# Patient Record
Sex: Male | Born: 1970
Health system: Southern US, Community
[De-identification: ages and names within clinical notes are randomized; demographics above are authoritative.]

## PROBLEM LIST (undated history)

## (undated) DIAGNOSIS — I10 Essential (primary) hypertension: Secondary | ICD-10-CM

## (undated) DIAGNOSIS — N529 Male erectile dysfunction, unspecified: Secondary | ICD-10-CM

## (undated) HISTORY — DX: Essential (primary) hypertension: I10

## (undated) HISTORY — DX: Male erectile dysfunction, unspecified: N52.9

---

## 2004-10-02 ENCOUNTER — Other Ambulatory Visit: Payer: Self-pay

## 2004-10-02 ENCOUNTER — Emergency Department: Payer: Self-pay | Admitting: Unknown Physician Specialty

## 2011-11-25 HISTORY — PX: CARPAL TUNNEL RELEASE: SHX101

## 2013-01-25 ENCOUNTER — Ambulatory Visit: Payer: Self-pay | Admitting: Orthopedic Surgery

## 2014-09-18 ENCOUNTER — Ambulatory Visit: Payer: Self-pay | Admitting: Family Medicine

## 2015-03-16 NOTE — Op Note (Signed)
PATIENT NAME:  Shane Hayes, [name] MR#:  [id number] DATE OF BIRTH:  05/05/1971  DATE OF PROCEDURE:  01/25/2013  PREOPERATIVE DIAGNOSIS: Left carpal tunnel syndrome.   POSTOPERATIVE DIAGNOSIS: Left carpal tunnel syndrome.   PROCEDURE: Left carpal tunnel release.   SURGEON: Leitha SchullerMichael J. Menz, MD  ANESTHESIA:  General.  DESCRIPTION OF PROCEDURE: The patient was brought to the operating room and, after adequate anesthesia was obtained, the left arm was prepped and draped in the usual sterile fashion. After patient identification and timeout procedures were completed, the tourniquet was raised to 250 mmHg. An approximately 2 cm incision was made in line with the ring metacarpal, over the carpal tunnel. The skin and subcutaneous tissue was incised and the transverse carpal ligament identified, a small incision made into it, and a hemostat placed underneath to protect the underlying structures. Release was carried out distally to a point where there was fat surrounding the nerve. In the proximal third of the carpal tunnel, there did appear to be compression on the nerve. After release there was good vascular blush. Exploration of the carpal tunnel revealed no masses. There was moderate flexor tenosynovitis. After adequate release of the nerve, the wound was irrigated and closed with simple interrupted 5-0 nylon skin sutures. 10 mL of 0.5% Sensorcaine without epinephrine was infiltrated in the tissue adjacent to the incision. Sterile dressing of Xeroform, 4 x 4's, Webril, and Ace wrap were applied, and the patient was sent to the recovery room in stable condition.   ESTIMATED BLOOD LOSS: Minimal.   COMPLICATIONS: None.        SPECIMEN: None.   TOURNIQUET TIME: 90 minutes at 250 mmHg. ____________________________ Leitha SchullerMichael J. Menz, MD mjm:sb D: 01/26/2013 06:52:03 ET T: 01/26/2013 07:06:49 ET JOB#: [id number]  cc: Leitha SchullerMichael J. Menz, MD, <Dictator> Leitha SchullerMICHAEL J MENZ MD ELECTRONICALLY SIGNED 01/26/2013 8:16

## 2015-08-03 ENCOUNTER — Encounter: Payer: Self-pay | Admitting: Family Medicine

## 2015-08-03 ENCOUNTER — Ambulatory Visit (INDEPENDENT_AMBULATORY_CARE_PROVIDER_SITE_OTHER): Payer: 59 | Admitting: Family Medicine

## 2015-08-03 VITALS — BP 116/86 | HR 66 | Temp 97.9°F | Resp 16 | Ht 65.5 in | Wt 223.8 lb

## 2015-08-03 DIAGNOSIS — R519 Headache, unspecified: Secondary | ICD-10-CM

## 2015-08-03 DIAGNOSIS — Z9889 Other specified postprocedural states: Secondary | ICD-10-CM | POA: Insufficient documentation

## 2015-08-03 DIAGNOSIS — Z0000 Encounter for general adult medical examination without abnormal findings: Secondary | ICD-10-CM

## 2015-08-03 DIAGNOSIS — Z87438 Personal history of other diseases of male genital organs: Secondary | ICD-10-CM | POA: Insufficient documentation

## 2015-08-03 DIAGNOSIS — R51 Headache: Secondary | ICD-10-CM | POA: Diagnosis not present

## 2015-08-03 DIAGNOSIS — N529 Male erectile dysfunction, unspecified: Secondary | ICD-10-CM

## 2015-08-03 DIAGNOSIS — J309 Allergic rhinitis, unspecified: Secondary | ICD-10-CM | POA: Insufficient documentation

## 2015-08-03 MED ORDER — SILDENAFIL CITRATE 100 MG PO TABS: 50.0000 mg | ORAL_TABLET | Freq: Every day | ORAL | Status: DC | PRN

## 2015-08-03 MED ORDER — AMITRIPTYLINE HCL 10 MG PO TABS: ORAL_TABLET | ORAL | Status: DC

## 2015-08-03 NOTE — Progress Notes (Signed)
Subjective:     Patient ID: Shane Hayes, male   DOB: Oct 14, 1971, 44 y.o.   MRN: [id number]  HPI  Chief Complaint  Patient presents with  . Annual Exam    Patient is present in office today for his annual physical he states that he would like to address headaches he has been having. Patient reports having a history of chronic headaches and would like to discuss being placed back on medication.  Estimates he gets 4 headaches/week lasting from 1-4 hours. Usually relieved with aspirin or BC powders. No associated nausea or photophobia. Generally will stay at work and not have to leave. States he drinks only one caffeine drink a day but has noticed trouble with near vision. 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 He has biometric form for completion today from his employer.   Review of Systems General: Feeling well HEENT: no dental or eye exams. Encourage to update. Cardiovascular: no chest pain, shortness of breath, or palpitations GI: no heartburn, no change in bowel habits or blood in the stool GU: nocturia x 1, feels he is urinating more frequently during the day. Wishes to try medication for previously diagnosed erectile dysfunction Psychiatric: not depressed: PHQ 2: 0 Musculoskeletal: no joint pain    Objective:   Physical Exam  Constitutional: He appears well-developed and well-nourished. No distress.  Eyes: PERRLA Neck: no thyromegaly, tenderness or nodules,  ENT: TM's intact without inflammation; Tonsils not well seen Lungs: Clear Heart : RRR without murmur or gallop Abd: bowel sounds present, soft, non-tender, no organomegaly Extremities: no edema     Assessment:    1. Annual physical exam - Comprehensive metabolic panel - Lipid panel  2. Frequent headaches: may be due to visual changes requiring correction - amitriptyline (ELAVIL) 10 MG tablet; One to three at bedtime  Dispense: 30 tablet; Refill: 1  3. Erectile dysfunction,  unspecified erectile dysfunction type - sildenafil (VIAGRA) 100 MG tablet; Take 0.5-1 tablets (50-100 mg total) by mouth daily as needed for erectile dysfunction.  Dispense: 5 tablet; Refill: 11    Plan:    Further f/u pending lab work. Form completion at that time.

## 2015-08-03 NOTE — Patient Instructions (Signed)
We will call you with the lab results. Please schedule eye and dental exams. Try amitriptyline daily to reduce headache.

## 2015-08-04 LAB — COMPREHENSIVE METABOLIC PANEL
A/G RATIO: 2.1 (ref 1.1–2.5)
ALBUMIN: 4.7 g/dL (ref 3.5–5.5)
ALT: 30 IU/L (ref 0–44)
AST: 21 IU/L (ref 0–40)
Alkaline Phosphatase: 65 IU/L (ref 39–117)
BILIRUBIN TOTAL: 0.4 mg/dL (ref 0.0–1.2)
BUN / CREAT RATIO: 11 (ref 9–20)
BUN: 15 mg/dL (ref 6–24)
CALCIUM: 10.1 mg/dL (ref 8.7–10.2)
CHLORIDE: 101 mmol/L (ref 97–108)
CO2: 25 mmol/L (ref 18–29)
Creatinine, Ser: 1.36 mg/dL — ABNORMAL HIGH (ref 0.76–1.27)
GFR, EST AFRICAN AMERICAN: 72 mL/min/{1.73_m2} (ref >59)
GFR, EST NON AFRICAN AMERICAN: 62 mL/min/{1.73_m2} (ref >59)
GLOBULIN, TOTAL: 2.2 g/dL (ref 1.5–4.5)
Glucose: 102 mg/dL — ABNORMAL HIGH (ref 65–99)
POTASSIUM: 4.2 mmol/L (ref 3.5–5.2)
Sodium: 141 mmol/L (ref 134–144)
TOTAL PROTEIN: 6.9 g/dL (ref 6.0–8.5)

## 2015-08-04 LAB — LIPID PANEL
CHOL/HDL RATIO: 3.8 ratio (ref 0.0–5.0)
Cholesterol, Total: 136 mg/dL (ref 100–199)
HDL: 36 mg/dL — AB (ref >39)
LDL Calculated: 82 mg/dL (ref 0–99)
Triglycerides: 89 mg/dL (ref 0–149)
VLDL Cholesterol Cal: 18 mg/dL (ref 5–40)

## 2015-08-06 ENCOUNTER — Telehealth: Payer: Self-pay

## 2015-08-06 NOTE — Telephone Encounter (Signed)
Faxed today

## 2015-08-06 NOTE — Telephone Encounter (Signed)
[id number] 

## 2015-08-06 NOTE — Telephone Encounter (Signed)
Unable to reach patient and let him know about labs, informed girlfriend that his papers have been faxed to employer and to have patient call office back to discuss labs. KW

## 2015-08-06 NOTE — Telephone Encounter (Signed)
Pt girlfriend, Candis states pt came in last Friday to have a CPE and left paper work for his employer.  Pt is requesting this fax back to his employer, [employer] as soon as possible.  Fax #.[phone].  CB#[phone] or [phone]

## 2015-08-06 NOTE — Telephone Encounter (Signed)
[name] have you seen/completed paperwork?

## 2015-08-06 NOTE — Telephone Encounter (Signed)
-----   Message from Anola Gurney, [place] sent at 08/06/2015  7:59 AM EDT ----- Mild sugar elevation and mild abnormality in your kidney function. Would like to repeat these labs in 3 months. Your cholesterol looks good.

## 2015-08-07 NOTE — Telephone Encounter (Signed)
Patient has been advised of lab report. KW 

## 2015-08-23 ENCOUNTER — Ambulatory Visit (INDEPENDENT_AMBULATORY_CARE_PROVIDER_SITE_OTHER): Payer: 59 | Admitting: Family Medicine

## 2015-08-23 ENCOUNTER — Encounter: Payer: Self-pay | Admitting: Family Medicine

## 2015-08-23 VITALS — BP 102/78 | HR 68 | Temp 97.8°F | Resp 16 | Ht 66.0 in | Wt 222.8 lb

## 2015-08-23 DIAGNOSIS — M546 Pain in thoracic spine: Secondary | ICD-10-CM | POA: Diagnosis not present

## 2015-08-23 DIAGNOSIS — N529 Male erectile dysfunction, unspecified: Secondary | ICD-10-CM

## 2015-08-23 MED ORDER — MELOXICAM 15 MG PO TABS: 15.0000 mg | ORAL_TABLET | Freq: Every day | ORAL | Status: DC

## 2015-08-23 MED ORDER — SILDENAFIL CITRATE 20 MG PO TABS: ORAL_TABLET | ORAL | Status: DC

## 2015-08-23 NOTE — Progress Notes (Signed)
Subjective:     Patient ID: Shane Hayes, male   DOB: 06-18-1971, 44 y.o.   MRN: [id number]  HPI  Chief Complaint  Patient presents with  . Flank Pain    Patient comes in office today with concerns of left sided back pain for the past 2 weeks, patient denies any fall or injury and states that it is hard for him to sleep or change positions.   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 Has not taken any medication for this. Reports pain increases with sitting and when he lies on his right side. Denies rash, shortness of breath or dysuria. Accompanied by his wife today.   Review of Systems  Genitourinary:       Wishes dose/formulation change of Viagra due to cost  Neurological:       Has not been consistently taking nortriptyline for his chronic headaches though in general he states frequency is less. Encouraged scheduling of medication.       Objective:   Physical Exam  Constitutional: He appears well-developed and well-nourished. No distress.  Pulmonary/Chest: Breath sounds normal. No respiratory distress. He has no wheezes.  Musculoskeletal:  Localizes pain to his left lower thoracic para-vertebral area. Not tender to the touch. No rash.       Assessment:    1. Left-sided thoracic back pain - meloxicam (MOBIC) 15 MG tablet; Take 1 tablet (15 mg total) by mouth daily.  Dispense: 30 tablet; Refill: 0  2. 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

## 2015-08-23 NOTE — Patient Instructions (Signed)
Let me know if your back pain does not improve.

## 2015-10-01 ENCOUNTER — Ambulatory Visit (INDEPENDENT_AMBULATORY_CARE_PROVIDER_SITE_OTHER): Payer: 59 | Admitting: Family Medicine

## 2015-10-01 ENCOUNTER — Ambulatory Visit
Admission: RE | Admit: 2015-10-01 | Discharge: 2015-10-01 | Disposition: A | Discharge status: Home / Self Care | Payer: 59 | Source: Ambulatory Visit | Attending: Family Medicine | Admitting: Family Medicine

## 2015-10-01 ENCOUNTER — Encounter: Payer: Self-pay | Admitting: Family Medicine

## 2015-10-01 ENCOUNTER — Telehealth: Payer: Self-pay | Admitting: Family Medicine

## 2015-10-01 VITALS — BP 122/90 | HR 90 | Temp 98.5°F | Resp 16 | Wt 228.4 lb

## 2015-10-01 DIAGNOSIS — M79662 Pain in left lower leg: Secondary | ICD-10-CM

## 2015-10-01 DIAGNOSIS — M7989 Other specified soft tissue disorders: Secondary | ICD-10-CM | POA: Diagnosis present

## 2015-10-01 DIAGNOSIS — R51 Headache: Secondary | ICD-10-CM | POA: Diagnosis not present

## 2015-10-01 DIAGNOSIS — R519 Headache, unspecified: Secondary | ICD-10-CM

## 2015-10-01 MED ORDER — MELOXICAM 15 MG PO TABS: 15.0000 mg | ORAL_TABLET | Freq: Every day | ORAL | Status: DC

## 2015-10-01 MED ORDER — AMITRIPTYLINE HCL 10 MG PO TABS: ORAL_TABLET | ORAL | Status: DC

## 2015-10-01 NOTE — Progress Notes (Signed)
Subjective:     Patient ID: Shane Hayes, male   DOB: 21-Jan-1971, 44 y.o.   MRN: [id number]  HPI  Chief Complaint  Patient presents with  . Leg Swelling    Patient comes in office today with concerns of swelling of his lower left extremity for the past 2 weeks. Patient states that over the weekend he had swelling of his calf, he denies being on any new medication or any new activities.   States he also developed left calf pain and a low grade fever since 11/4. Swelling has gone done today per his report. No specific injury. 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 Though at times he must walk up stairs. Accompanied by his wife today.   Review of Systems  Respiratory: Negative for shortness of breath.        Objective:   Physical Exam  Constitutional: He appears well-developed and well-nourished. No distress.  Cardiovascular:  Pulses:      Dorsalis pedis pulses are 2+ on the left side.       Posterior tibial pulses are 2+ on the left side.  Musculoskeletal:  Left calf is swollen and tender though no erythema is appreciated. No pitting edema distally.       Assessment:    1. Frequent headache - amitriptyline (ELAVIL) 10 MG tablet; One to three at bedtime  Dispense: 30 tablet; Refill: 5  2. Calf pain, left: R/o DVT ? gastroc. strain - meloxicam (MOBIC) 15 MG tablet; Take 1 tablet (15 mg total) by mouth daily.  Dispense: 30 tablet; Refill: 0 - US Venous Img Lower Unilateral Left; Future    Plan:    Further f/u pending doppler result.

## 2015-10-01 NOTE — Telephone Encounter (Signed)
Call Report Ultrasound venous left lower leg for DVT: Negative for DVT in the left leg,complex fluid collection in the posterior calf. Could be a localized hematoma or represent changes of ruptured popliteal as clinical coordination reccommended.

## 2015-10-01 NOTE — Patient Instructions (Signed)
We will call with your results.  

## 2015-10-03 ENCOUNTER — Telehealth: Payer: Self-pay | Admitting: Family Medicine

## 2015-10-03 ENCOUNTER — Other Ambulatory Visit: Payer: Self-pay | Admitting: Family Medicine

## 2015-10-03 DIAGNOSIS — M79605 Pain in left leg: Secondary | ICD-10-CM

## 2015-10-03 NOTE — Telephone Encounter (Signed)
Patients girlfriend Financial trader( Candice) had contacted office because she has concerns about patients leg. She reports that swelling is worse around area but denies patient complaining of pain or fever. She is cocnerned and wants to know what they should do see ultrasound did not show blood clot? Please advise. KW

## 2015-10-03 NOTE — Telephone Encounter (Signed)
Advised girlfriend that referral has been put in place for patient to be followed up through Ortho, per Nadine CountsBob okay to advise patient to return to work if he is not experiencing pain. KW

## 2015-10-03 NOTE — Telephone Encounter (Signed)
I am going to refer him to orthopedics for evaluation. The swelling in his calf may hurt the calf muscle.

## 2015-11-12 ENCOUNTER — Other Ambulatory Visit: Payer: Self-pay | Admitting: Family Medicine

## 2015-12-31 ENCOUNTER — Other Ambulatory Visit: Payer: Self-pay | Admitting: Family Medicine

## 2015-12-31 ENCOUNTER — Telehealth: Payer: Self-pay | Admitting: Family Medicine

## 2015-12-31 DIAGNOSIS — R51 Headache: Principal | ICD-10-CM

## 2015-12-31 DIAGNOSIS — R519 Headache, unspecified: Secondary | ICD-10-CM

## 2015-12-31 MED ORDER — AMITRIPTYLINE HCL 10 MG PO TABS: ORAL_TABLET | ORAL | Status: DC

## 2015-12-31 NOTE — Telephone Encounter (Signed)
done

## 2015-12-31 NOTE — Telephone Encounter (Signed)
Please review. Thanks!  

## 2015-12-31 NOTE — Telephone Encounter (Signed)
Pt's girlfriend called and request a refill be sent for amitriptyline (ELAVIL) 10 MG tablet to [facility] b/c pt's insurance doesn't cover [facility]. Thanks TNP

## 2016-01-18 ENCOUNTER — Telehealth: Payer: Self-pay | Admitting: Family Medicine

## 2016-01-18 ENCOUNTER — Other Ambulatory Visit: Payer: Self-pay | Admitting: Family Medicine

## 2016-01-18 NOTE — Telephone Encounter (Signed)
Should have 5 refills left on prior prescription.

## 2016-01-18 NOTE — Telephone Encounter (Signed)
Pt contacted office for refill request on the following medications:  amitriptyline (ELAVIL) 10 MG tablet.  [facility].  [phone]

## 2016-01-18 NOTE — Telephone Encounter (Signed)
Please review. Thanks!  

## 2016-01-21 NOTE — Telephone Encounter (Signed)
Unable to reach patient at this time phone continues to ring with no answer, will try contacting patient again at a later time. KW

## 2016-01-23 NOTE — Telephone Encounter (Signed)
[id number] 

## 2016-02-12 ENCOUNTER — Ambulatory Visit (INDEPENDENT_AMBULATORY_CARE_PROVIDER_SITE_OTHER): Payer: 59 | Admitting: Family Medicine

## 2016-02-12 ENCOUNTER — Encounter: Payer: Self-pay | Admitting: Family Medicine

## 2016-02-12 VITALS — BP 142/96 | HR 64 | Temp 97.6°F | Resp 16 | Wt 227.2 lb

## 2016-02-12 DIAGNOSIS — R519 Headache, unspecified: Secondary | ICD-10-CM | POA: Insufficient documentation

## 2016-02-12 DIAGNOSIS — R51 Headache: Secondary | ICD-10-CM | POA: Diagnosis not present

## 2016-02-12 DIAGNOSIS — K529 Noninfective gastroenteritis and colitis, unspecified: Secondary | ICD-10-CM

## 2016-02-12 MED ORDER — AMITRIPTYLINE HCL 10 MG PO TABS: ORAL_TABLET | ORAL | Status: DC

## 2016-02-12 NOTE — Patient Instructions (Signed)
Discussed use of Gatorade and continue to eat as tolerated. May use imodium for loose stools.

## 2016-02-12 NOTE — Progress Notes (Signed)
Subjective:     Patient ID: Shane Hayes, male   DOB: 03/25/1971, 45 y.o.   MRN: [id number]  HPI  Chief Complaint  Patient presents with  . Headache    Patient comes in office today to address frequent headaches that he has been experencing intermittent since 10/01/15. Patient reports taking Amtriptyline but states it has not helped with headaches.   . Emesis    Patient complains of vomiting and diarrhea since Saturday 3/18, he states that he has weakness and yesterday felt feverish. Patient denies anyone else in household with similar symptoms  States he has been taking amitriptyline daily (sleep time is from 7 AM to 1:30 PM) but has only exceeded two pills on two occasions. Continue to have daily headaches at this dose. Also reports family members have been ill prior to developing vomiting and diarrhea. Vomiting has abated but still has had two watery stools today. Has resumed eating and has been drinking water but still feels weak. Has missed two days of work.   Review of Systems     Objective:   Physical Exam  Constitutional: He appears well-developed and well-nourished. No distress.  Abdominal: Soft. Bowel sounds are normal. There is no tenderness.       Assessment:    1. Frequent headaches: will increase to 30 mg. - amitriptyline (ELAVIL) 10 MG tablet; three at bedtime  Dispense: 90 tablet; Refill: 3  2. Gastroenteritis    Plan:    Work excuse for 3/20-22. Discussed use of Gatorade, imodium, and continuing to eat.

## 2016-05-13 ENCOUNTER — Encounter: Payer: Self-pay | Admitting: Family Medicine

## 2016-05-13 ENCOUNTER — Ambulatory Visit (INDEPENDENT_AMBULATORY_CARE_PROVIDER_SITE_OTHER): Payer: 59 | Admitting: Family Medicine

## 2016-05-13 VITALS — BP 130/92 | HR 96 | Temp 98.6°F | Resp 16 | Wt 228.0 lb

## 2016-05-13 DIAGNOSIS — R51 Headache: Secondary | ICD-10-CM

## 2016-05-13 DIAGNOSIS — R0683 Snoring: Secondary | ICD-10-CM | POA: Diagnosis not present

## 2016-05-13 DIAGNOSIS — R519 Headache, unspecified: Secondary | ICD-10-CM

## 2016-05-13 MED ORDER — METOPROLOL SUCCINATE ER 50 MG PO TB24: 50.0000 mg | ORAL_TABLET | Freq: Every day | ORAL | Status: DC

## 2016-05-13 NOTE — Patient Instructions (Addendum)
Please complete the [place] screen with your wife and return it to the office. If you can't tolerate the medication let me now before your appointment.

## 2016-05-13 NOTE — Progress Notes (Signed)
Subjective:     Patient ID: Shane Hayes, male   DOB: 10/26/1971, 45 y.o.   MRN: [id number]  HPI  Chief Complaint  Patient presents with  . Headache    FU from 02/12/2016. Increased Amitriptyline to 30 mg qhs at LOV. 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 Sleeps for about 5 1/2 hours, and gets a H/A around 4-5:00 pm. H/A occurs 4-5 times per week.  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 Second shift job is at [employer]. Reports he takes otc medication for headaches 4-5 x week. Describes them as pressure over his frontal scalp. Does not leave work when he gets these. States his wife tell him he snores. She is currently pregnant.   Review of Systems     Objective:   Physical Exam  Constitutional: He appears well-developed and well-nourished. No distress.  Psychiatric: He has a normal mood and affect. His behavior is normal.       Assessment:    1. Frequent headaches: stop amitryptyline - metoprolol succinate (TOPROL-XL) 50 MG 24 hr tablet; Take 1 tablet (50 mg total) by mouth daily. Take with or immediately following a meal.  Dispense: 30 tablet; Refill: 0  2. Snoring:     Plan:    Epworth screen provided. Will f/u in 2 weeks.

## 2016-05-23 ENCOUNTER — Ambulatory Visit (INDEPENDENT_AMBULATORY_CARE_PROVIDER_SITE_OTHER): Payer: 59 | Admitting: Family Medicine

## 2016-05-23 ENCOUNTER — Encounter: Payer: Self-pay | Admitting: Family Medicine

## 2016-05-23 VITALS — BP 132/86 | HR 80 | Temp 98.5°F | Resp 16 | Wt 228.0 lb

## 2016-05-23 DIAGNOSIS — R519 Headache, unspecified: Secondary | ICD-10-CM

## 2016-05-23 DIAGNOSIS — R51 Headache: Secondary | ICD-10-CM

## 2016-05-23 DIAGNOSIS — J301 Allergic rhinitis due to pollen: Secondary | ICD-10-CM | POA: Diagnosis not present

## 2016-05-23 DIAGNOSIS — R0683 Snoring: Secondary | ICD-10-CM | POA: Diagnosis not present

## 2016-05-23 MED ORDER — METOPROLOL SUCCINATE ER 50 MG PO TB24: 50.0000 mg | ORAL_TABLET | Freq: Every day | ORAL | Status: DC

## 2016-05-23 MED ORDER — FLUTICASONE PROPIONATE 50 MCG/ACT NA SUSP: 2.0000 | Freq: Every day | NASAL | Status: DC

## 2016-05-23 NOTE — Progress Notes (Signed)
Subjective:     Patient ID: Shane Hayes, male   DOB: May 02, 1971, [age] y.o.   MRN: [id number]  HPI  Chief Complaint  Patient presents with  . Headache    2 week follow up  States he has had less frequency of headaches since starting Metoprolol. He has completed the [place] screen with his wife and will drop it off later today. Reports his allergies have flared and wishes medication to control.   Review of Systems     Objective:   Physical Exam  Constitutional: He appears well-developed and well-nourished. No distress.  Ears: T.M's intact without inflammation Throat: no tonsillar enlargement or exudate Neck: no cervical adenopathy Lungs: clear    Assessment:    1. Frequent headaches - metoprolol succinate (TOPROL-XL) 50 MG 24 hr tablet; Take 1 tablet (50 mg total) by mouth daily. Take with or immediately following a meal.  Dispense: 30 tablet; Refill: 5  2. Snoring  3. Allergic rhinitis due to pollen - fluticasone (FLONASE) 50 MCG/ACT nasal spray; Place 2 sprays into both nostrils daily.  Dispense: 16 g; Refill: 6    Plan:    Further f/u pending Epworth screen.

## 2016-05-23 NOTE — Patient Instructions (Signed)
Please give the steroid nasal spray a few days to work. Also try Claritin or Allegra daily. I will call you about the Epworth screen after I review it.

## 2016-05-28 ENCOUNTER — Ambulatory Visit: Payer: Self-pay | Admitting: Family Medicine

## 2016-08-22 ENCOUNTER — Encounter: Payer: Self-pay | Admitting: Family Medicine

## 2016-08-22 ENCOUNTER — Ambulatory Visit (INDEPENDENT_AMBULATORY_CARE_PROVIDER_SITE_OTHER): Payer: 59 | Admitting: Family Medicine

## 2016-08-22 VITALS — BP 120/82 | HR 82 | Temp 97.8°F | Resp 14 | Ht 66.0 in | Wt 234.0 lb

## 2016-08-22 DIAGNOSIS — J301 Allergic rhinitis due to pollen: Secondary | ICD-10-CM

## 2016-08-22 DIAGNOSIS — Z0000 Encounter for general adult medical examination without abnormal findings: Secondary | ICD-10-CM | POA: Diagnosis not present

## 2016-08-22 DIAGNOSIS — R51 Headache: Secondary | ICD-10-CM

## 2016-08-22 DIAGNOSIS — R519 Headache, unspecified: Secondary | ICD-10-CM

## 2016-08-22 NOTE — Patient Instructions (Signed)
Wear mask at work and try cromolyn sodium (Nasalcrom) for allergy prevention. Discussed walking for 30 minutes daily

## 2016-08-22 NOTE — Progress Notes (Addendum)
Subjective:     Patient ID: Shane Hayes, male   DOB: 29-Apr-1971, 45 y.o.   MRN: [id number]  HPI  Chief Complaint  Patient presents with  . Annual Exam    health forms filled out for employer  States he feels well and is accompanied by his wife today. She is expecting their third child, a daughter. 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  Review of Systems General: Feeling well HEENT: No regular dental visits-encouraged to do so. Eye exam this year with glasses. Cardiovascular: no chest pain, shortness of breath, or palpitations GI: no heartburn, no change in bowel habits or blood in the stool GU: nocturia x 0, no change in bladder habits Neuro: headaches improved with metoprolol but states exposure to dust at work may be a trigger as well.  Psychiatric: not depressed Musculoskeletal: no joint pain    Objective:   Physical Exam  Constitutional: He appears well-developed and well-nourished. No distress.  Eyes: PERRLA Neck: no thyromegaly, tenderness or nodules,  ENT: TM's intact without inflammation; No tonsillar enlargement or exudate, Lungs: Clear Heart : RRR without murmur or gallop Abd: bowel sounds present, soft, non-tender, no organomegaly Extremities: no edema     Assessment:    1. Annual physical exam - Comprehensive metabolic panel - Lipid panel  2. Allergic rhinitis due to pollen  3. Frequent headaches    Plan:    Further f/u pending lab work. Discussed use of cromolyn sodium for prevention of allergic response and mask use at work. Encouraged walking for 30 minutes daily due to elevated BMI. Patient has taken health forms partially filled out as they were due today. Will provide with copy of lab slip when available.

## 2016-08-23 LAB — COMPREHENSIVE METABOLIC PANEL
A/G RATIO: 1.7 (ref 1.2–2.2)
ALBUMIN: 4.4 g/dL (ref 3.5–5.5)
ALT: 27 IU/L (ref 0–44)
AST: 22 IU/L (ref 0–40)
Alkaline Phosphatase: 72 IU/L (ref 39–117)
BUN / CREAT RATIO: 8 — AB (ref 9–20)
BUN: 10 mg/dL (ref 6–24)
Bilirubin Total: 0.4 mg/dL (ref 0.0–1.2)
CALCIUM: 10 mg/dL (ref 8.7–10.2)
CO2: 28 mmol/L (ref 18–29)
CREATININE: 1.27 mg/dL (ref 0.76–1.27)
Chloride: 103 mmol/L (ref 96–106)
GFR, EST AFRICAN AMERICAN: 78 mL/min/{1.73_m2} (ref >59)
GFR, EST NON AFRICAN AMERICAN: 67 mL/min/{1.73_m2} (ref >59)
GLOBULIN, TOTAL: 2.6 g/dL (ref 1.5–4.5)
Glucose: 89 mg/dL (ref 65–99)
Potassium: 4.6 mmol/L (ref 3.5–5.2)
SODIUM: 143 mmol/L (ref 134–144)
Total Protein: 7 g/dL (ref 6.0–8.5)

## 2016-08-23 LAB — LIPID PANEL
CHOL/HDL RATIO: 3.7 ratio (ref 0.0–5.0)
Cholesterol, Total: 141 mg/dL (ref 100–199)
HDL: 38 mg/dL — ABNORMAL LOW (ref >39)
LDL CALC: 88 mg/dL (ref 0–99)
Triglycerides: 74 mg/dL (ref 0–149)
VLDL Cholesterol Cal: 15 mg/dL (ref 5–40)

## 2016-08-25 ENCOUNTER — Telehealth: Payer: Self-pay

## 2016-08-25 NOTE — Telephone Encounter (Signed)
-----   Message from Anola Gurneyobert Chauvin, [place] sent at 08/25/2016  8:00 AM EDT ----- Labs are good! HDL (good) cholesterol is a little low but will come up with regular exercise.

## 2016-08-25 NOTE — Telephone Encounter (Signed)
Patient advised, copy of labs was left upfront for pick up. KW

## 2016-08-27 ENCOUNTER — Encounter: Payer: Self-pay | Admitting: Family Medicine

## 2016-08-27 ENCOUNTER — Ambulatory Visit (INDEPENDENT_AMBULATORY_CARE_PROVIDER_SITE_OTHER): Payer: 59 | Admitting: Family Medicine

## 2016-08-27 VITALS — BP 122/84 | HR 68 | Temp 98.1°F | Wt 238.4 lb

## 2016-08-27 DIAGNOSIS — K529 Noninfective gastroenteritis and colitis, unspecified: Secondary | ICD-10-CM

## 2016-08-27 NOTE — Progress Notes (Signed)
Subjective:     Patient ID: Shane Hayes, male   DOB: 12-25-1970, 45 y.o.   MRN: [id number]  HPI  Chief Complaint  Patient presents with  . Diarrhea    Patient comes in office today with concerns of diarrhea and headache since 08/22/16.    States he had one episode of vomiting. Reports frequency of stools has lessened and wishes to return to work tomorrow. Has kept up with p.o. Intake.   Review of Systems     Objective:   Physical Exam  Constitutional: He appears well-developed and well-nourished. No distress.  Abdominal: Bowel sounds are normal. There is no tenderness.       Assessment:    1. Gastroenteritis: resolving     Plan:    Work excuse for 10/2-10/4. Continue with p.o. Intake. Imodium if needed.

## 2016-08-27 NOTE — Patient Instructions (Signed)
Continue to eat as tolerated and keep up with your fluids.

## 2016-08-28 ENCOUNTER — Encounter: Payer: Self-pay | Admitting: Family Medicine

## 2016-08-28 ENCOUNTER — Telehealth: Payer: Self-pay | Admitting: Family Medicine

## 2016-08-28 NOTE — Telephone Encounter (Signed)
Yes - the prior note was from 10/2-10/4. Make the current note from 10/2 to 10/6 dx: gastroenteritis

## 2016-08-28 NOTE — Telephone Encounter (Signed)
Sandria BalesJennifer Dodson is updating note. Allene DillonEmily Drozdowski, CMA

## 2016-08-28 NOTE — Telephone Encounter (Signed)
Pt needs a note for work for yesterday and today.  Still having stomach problems  His call back is [phone]  He will come pick up when it is ready.  Thanks, Fortune Brandsteri

## 2016-08-28 NOTE — Telephone Encounter (Signed)
Ok to write note? Allene DillonEmily Drozdowski, CMA

## 2016-12-15 ENCOUNTER — Encounter: Payer: Self-pay | Admitting: Physician Assistant

## 2016-12-15 ENCOUNTER — Ambulatory Visit (INDEPENDENT_AMBULATORY_CARE_PROVIDER_SITE_OTHER): Payer: 59 | Admitting: Physician Assistant

## 2016-12-15 VITALS — BP 118/84 | HR 96 | Temp 98.7°F | Resp 16 | Wt 240.0 lb

## 2016-12-15 DIAGNOSIS — R6889 Other general symptoms and signs: Secondary | ICD-10-CM

## 2016-12-15 MED ORDER — OSELTAMIVIR PHOSPHATE 75 MG PO CAPS: 75.0000 mg | ORAL_CAPSULE | Freq: Two times a day (BID) | ORAL | 0 refills | Status: AC

## 2016-12-15 NOTE — Patient Instructions (Signed)

## 2016-12-15 NOTE — Progress Notes (Signed)
[facility]  Chief Complaint  Patient presents with  . URI    Started Thursday    Subjective:    Patient ID: Shane Hayes, male    DOB: 1971-11-19, 46 y.o.   MRN: [id number]  Upper Respiratory Infection: Shane Hayes is a 46 y.o. male with a past medical history significant for Allergiescomplaining of symptoms of a URI. Symptoms include congestion, cough and fever. Onset of symptoms was 4 days ago, unchanged since that time. He also c/o achiness, congestion and low grade fever for the past 4 days .  He is drinking plenty of fluids. Evaluation to date: none. Treatment to date: none. Patient's wife was diagnosed with flu this weekend in the [place]. Patient did not get flu shot this year.  Review of Systems  Constitutional: Positive for fatigue and fever. Negative for activity change, appetite change, chills, diaphoresis and unexpected weight change.  HENT: Positive for congestion, sinus pain and sinus pressure. Negative for ear discharge, ear pain, nosebleeds, postnasal drip, rhinorrhea and sore throat.   Eyes: Negative.   Respiratory: Positive for cough, chest tightness, shortness of breath and wheezing. Negative for apnea.   Gastrointestinal: Negative.   Musculoskeletal: Positive for myalgias and neck pain.  Neurological: Negative for dizziness, light-headedness and headaches.       Objective:   BP 118/84 (BP Location: Left Arm, Patient Position: Sitting, Cuff Size: Large)   Pulse 96   Temp 98.7 F (37.1 C) (Oral)   Resp 16   Wt 240 lb (108.9 kg)   BMI 38.74 kg/m   Patient Active Problem List   Diagnosis Date Noted  . Frequent headaches 02/12/2016  . Allergic rhinitis 08/03/2015  . History of decompression of median nerve 08/03/2015  . H/O erectile dysfunction 08/03/2015    Outpatient Encounter Prescriptions as of 12/15/2016  Medication Sig  . fluticasone (FLONASE) 50 MCG/ACT nasal spray Place 2 sprays into both nostrils daily.   . metoprolol succinate (TOPROL-XL) 50 MG 24 hr tablet Take 1 tablet (50 mg total) by mouth daily. Take with or immediately following a meal.  . Multiple Vitamin (MULTIVITAMIN) tablet Take 1 tablet by mouth daily.  Marland Kitchen oseltamivir (TAMIFLU) 75 MG capsule Take 1 capsule (75 mg total) by mouth 2 (two) times daily.   No facility-administered encounter medications on file as of 12/15/2016.     No Known Allergies     Physical Exam  Constitutional: He appears well-developed and well-nourished. He appears ill. No distress.  HENT:  Mouth/Throat: Posterior oropharyngeal erythema present.  Cardiovascular: Normal rate and regular rhythm.   Pulmonary/Chest: Effort normal and breath sounds normal.  Lymphadenopathy:    He has no cervical adenopathy.  Neurological: He is alert.  Skin: Skin is warm and dry.       Assessment & Plan:  1. Flu-like symptoms   Rapid flu swab in office was negative, but considering patient's sx and hx of exposure/non-vaccination, will treat as below.  - oseltamivir (TAMIFLU) 75 MG capsule; Take 1 capsule (75 mg total) by mouth 2 (two) times daily.  Dispense: 10 capsule; Refill: 0  Recommend rest, fluids, frequent hand washing. Work note provided  Return if symptoms worsen or fail to improve.   Patient Instructions  Influenza, Adult Influenza ("the flu") is an infection in the lungs, nose, and throat (respiratory tract). It is caused by a virus. The flu causes many common cold symptoms, as well as a high fever and body aches. It can make you  feel very sick. The flu spreads easily from person to person (is contagious). Getting a flu shot (influenza vaccination) every year is the best way to prevent the flu. Follow these instructions at home:  Take over-the-counter and prescription medicines only as told by your doctor.  Use a cool mist humidifier to add moisture (humidity) to the air in your home. This can make it easier to breathe.  Rest as needed.  Drink  enough fluid to keep your pee (urine) clear or pale yellow.  Cover your mouth and nose when you cough or sneeze.  Wash your hands with soap and water often, especially after you cough or sneeze. If you cannot use soap and water, use hand sanitizer.  Stay home from work or school as told by your doctor. Unless you are visiting your doctor, try to avoid leaving home until your fever has been gone for 24 hours without the use of medicine.  Keep all follow-up visits as told by your doctor. This is important. How is this prevented?  Getting a yearly (annual) flu shot is the best way to avoid getting the flu. You may get the flu shot in late summer, fall, or winter. Ask your doctor when you should get your flu shot.  Wash your hands often or use hand sanitizer often.  Avoid contact with people who are sick during cold and flu season.  Eat healthy foods.  Drink plenty of fluids.  Get enough sleep.  Exercise regularly. Contact a doctor if:  You get new symptoms.  You have:  Chest pain.  Watery poop (diarrhea).  A fever.  Your cough gets worse.  You start to have more mucus.  You feel sick to your stomach (nauseous).  You throw up (vomit). Get help right away if:  You start to be short of breath or have trouble breathing.  Your skin or nails turn a bluish color.  You have very bad pain or stiffness in your neck.  You get a sudden headache.  You get sudden pain in your face or ear.  You cannot stop throwing up. This information is not intended to replace advice given to you by your health care provider. Make sure you discuss any questions you have with your health care provider. Document Released: 08/19/2008 Document Revised: 04/17/2016 Document Reviewed: 09/04/2015 Elsevier Interactive Patient Education  2017 [employer]     The entirety of the information documented in the History of Present Illness, Review of Systems and Physical Exam were personally  obtained by me. Portions of this information were initially documented by Kavin LeechLaura Beth Spackman, CMA and reviewed by me for thoroughness and accuracy.

## 2017-01-06 ENCOUNTER — Other Ambulatory Visit: Payer: Self-pay | Admitting: Family Medicine

## 2017-01-06 DIAGNOSIS — R519 Headache, unspecified: Secondary | ICD-10-CM

## 2017-01-06 DIAGNOSIS — R51 Headache: Principal | ICD-10-CM

## 2017-08-28 ENCOUNTER — Encounter: Payer: Self-pay | Admitting: Physician Assistant

## 2017-08-28 ENCOUNTER — Ambulatory Visit (INDEPENDENT_AMBULATORY_CARE_PROVIDER_SITE_OTHER): Payer: 59 | Admitting: Physician Assistant

## 2017-08-28 VITALS — BP 136/88 | HR 72 | Temp 98.4°F | Resp 16 | Ht 66.5 in | Wt 228.0 lb

## 2017-08-28 DIAGNOSIS — Z0000 Encounter for general adult medical examination without abnormal findings: Secondary | ICD-10-CM | POA: Diagnosis not present

## 2017-08-28 DIAGNOSIS — Z13220 Encounter for screening for lipoid disorders: Secondary | ICD-10-CM

## 2017-08-28 DIAGNOSIS — Z23 Encounter for immunization: Secondary | ICD-10-CM

## 2017-08-28 DIAGNOSIS — Z1211 Encounter for screening for malignant neoplasm of colon: Secondary | ICD-10-CM | POA: Diagnosis not present

## 2017-08-28 DIAGNOSIS — Z1329 Encounter for screening for other suspected endocrine disorder: Secondary | ICD-10-CM

## 2017-08-28 DIAGNOSIS — R519 Headache, unspecified: Secondary | ICD-10-CM

## 2017-08-28 DIAGNOSIS — R51 Headache: Secondary | ICD-10-CM

## 2017-08-28 DIAGNOSIS — J301 Allergic rhinitis due to pollen: Secondary | ICD-10-CM | POA: Diagnosis not present

## 2017-08-28 MED ORDER — FLUTICASONE PROPIONATE 50 MCG/ACT NA SUSP: 2.0000 | Freq: Every day | NASAL | 6 refills | Status: DC

## 2017-08-28 MED ORDER — METOPROLOL SUCCINATE ER 50 MG PO TB24: 50.0000 mg | ORAL_TABLET | Freq: Every day | ORAL | 5 refills | Status: DC

## 2017-08-28 NOTE — Patient Instructions (Signed)

## 2017-08-28 NOTE — Progress Notes (Signed)
Patient: Shane Hayes, Male    DOB: 05/17/1971, 46 y.o.   MRN: [id number] Visit Date: 08/28/2017  Today's Provider: Trey Sailors, PA-C   Chief Complaint  Patient presents with  . Annual Exam   Subjective:    Annual physical exam Shane Hayes is a 46 y.o. male who presents today for health maintenance and complete physical. He feels fairly well. He reports not exercising. He reports he is sleeping fairly well.  Live in [place], has a male partner. He has two older children in their 40's and one 26 mo old child. 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 Not exercising or eating balanced diet.   He reports having worsening headaches over the past 1.5 mo. Reports these are his typical headaches, has been taking pain relief every day for them. Of note, he ran out of his metoprolol 50 mg which was prescribed for frequent headaches.   Has not had flu shot this season. Desires one today.  Has never had colonoscopy. No family history of colon cancer or prostate cancer.  -----------------------------------------------------------------   Review of Systems  Constitutional: Negative.   HENT: Negative.   Eyes: Negative.   Respiratory: Negative.   Cardiovascular: Negative.   Gastrointestinal: Negative.   Endocrine: Negative.   Genitourinary: Negative.   Musculoskeletal: Negative.   Skin: Negative.   Allergic/Immunologic: Negative.   Neurological: Negative.   Hematological: Negative.   Psychiatric/Behavioral: Negative.     Social History      He  reports that he has been smoking Cigars.  He has never used smokeless tobacco. He reports that he does not drink alcohol or use drugs.       Social History   Social History  . Marital status: Divorced    Spouse name: N/A  . Number of children: N/A  . Years of education: N/A   Social History Main Topics  . Smoking status: Current Every Day Smoker    Types: Cigars  . Smokeless tobacco: Never Used  . Alcohol use No  . Drug use:  No  . Sexual activity: Yes    Birth control/ protection: None   Other Topics Concern  . None   Social History Narrative  . None    Past Medical History:  Diagnosis Date  . Erectile dysfunction      Patient Active Problem List   Diagnosis Date Noted  . Frequent headaches 02/12/2016  . Allergic rhinitis 08/03/2015  . History of decompression of median nerve 08/03/2015  . H/O erectile dysfunction 08/03/2015    Past Surgical History:  Procedure Laterality Date  . CARPAL TUNNEL RELEASE Left 2013    Family History        Family Status  Relation Status  . Mother Alive  . Father Other  . Sister Alive  . Daughter Alive  . Son Alive  . MGM Deceased  . MGF Deceased       died from complications from pneumonia  . PGM Other  . PGF Other  . Daughter Alive        His family history includes Congestive Heart Failure in his maternal grandmother; Healthy in his daughter, daughter, mother, sister, and son.     No Known Allergies   Current Outpatient Prescriptions:  .  fluticasone (FLONASE) 50 MCG/ACT nasal spray, Place 2 sprays into both nostrils daily., Disp: 16 g, Rfl: 6 .  metoprolol succinate (TOPROL-XL) 50 MG 24 hr tablet, TAKE 1 TABLET BY MOUTH DAILY WITH  OR IMMEDIATELY FOLLOWING A MEAL., Disp: 30 tablet, Rfl: 5 .  Multiple Vitamin (MULTIVITAMIN) tablet, Take 1 tablet by mouth daily., Disp: , Rfl:    Patient Care Team: Anola Gurney, PA as PCP - General (Family Medicine)      Objective:   Vitals: BP 136/88 (BP Location: Right Arm, Patient Position: Sitting, Cuff Size: Large)   Pulse 72   Temp 98.4 F (36.9 C) (Oral)   Resp 16   Ht 5' 6.5" (1.689 m)   Wt 228 lb (103.4 kg)   BMI 36.25 kg/m    Vitals:   08/28/17 0908  BP: 136/88  Pulse: 72  Resp: 16  Temp: 98.4 F (36.9 C)  TempSrc: Oral  Weight: 228 lb (103.4 kg)  Height: 5' 6.5" (1.689 m)     Physical Exam  Constitutional: He is oriented to person, place, and time. He appears well-developed  and well-nourished.  HENT:  Right Ear: External ear normal.  Left Ear: External ear normal.  Mouth/Throat: Oropharynx is clear and moist. No oropharyngeal exudate.  Eyes: Conjunctivae are normal.  Neck: Neck supple.  Cardiovascular: Normal rate, regular rhythm and normal heart sounds.   Pulmonary/Chest: Effort normal and breath sounds normal. No respiratory distress. He has no wheezes. He has no rales.  Abdominal: Soft. Bowel sounds are normal. He exhibits no distension. There is no tenderness. There is no rebound and no guarding.  Lymphadenopathy:    He has no cervical adenopathy.  Neurological: He is alert and oriented to person, place, and time.  Skin: Skin is warm and dry.  Psychiatric: He has a normal mood and affect. His behavior is normal.     Depression Screen PHQ 2/9 Scores 08/22/2016  PHQ - 2 Score 0      Assessment & Plan:     Routine Health Maintenance and Physical Exam  Exercise Activities and Dietary recommendations Goals    None      Immunization History  Administered Date(s) Administered  . Tdap 12/23/2013    Health Maintenance  Topic Date Due  . HIV Screening  06/08/1985  . INFLUENZA VACCINE  06/24/2018 (Originally 06/24/2017)  . TETANUS/TDAP  12/24/2023     Discussed health benefits of physical activity, and encouraged him to engage in regular exercise appropriate for his age and condition.    1. Annual physical exam  Signed work form. Counseled on diet and exercise.  - Comprehensive Metabolic Panel (CMET) - CBC with Differential  2. Frequent headaches  Resume metoprolol.  - metoprolol succinate (TOPROL-XL) 50 MG 24 hr tablet; Take 1 tablet (50 mg total) by mouth daily. Take with or immediately following a meal.  Dispense: 30 tablet; Refill: 5  3. Thyroid disorder screening   - TSH  4. Lipid screening   - Lipid Profile  5. Colon cancer screening  46 y/o AA male needing screening colonoscopy.  - Ambulatory referral to  Gastroenterology  6. Allergic rhinitis due to pollen, unspecified seasonality  - fluticasone (FLONASE) 50 MCG/ACT nasal spray; Place 2 sprays into both nostrils daily.  Dispense: 16 g; Refill: 6  7. Flu vaccine need  - Flu Vaccine QUAD 6+ mos PF IM (Fluarix Quad PF)  8. Influenza vaccination administered at current visit  Return in about 1 year (around 08/28/2018), or if symptoms worsen or fail to improve.  The entirety of the information documented in the History of Present Illness, Review of Systems and Physical Exam were personally obtained by me. Portions of this information were initially documented  by Kavin Leech, CMA and reviewed by me for thoroughness and accuracy.    --------------------------------------------------------------------    Trey Sailors, PA-C  [facility]

## 2017-08-30 LAB — COMPLETE METABOLIC PANEL WITH GFR
AG Ratio: 1.7 (calc) (ref 1.0–2.5)
ALT: 26 U/L (ref 9–46)
AST: 19 U/L (ref 10–40)
Albumin: 4.5 g/dL (ref 3.6–5.1)
Alkaline phosphatase (APISO): 61 U/L (ref 40–115)
BUN: 13 mg/dL (ref 7–25)
CO2: 21 mmol/L (ref 20–32)
Calcium: 10.2 mg/dL (ref 8.6–10.3)
Chloride: 105 mmol/L (ref 98–110)
Creat: 1.23 mg/dL (ref 0.60–1.35)
GFR, Est African American: 81 mL/min/{1.73_m2} (ref >=60)
GFR, Est Non African American: 69 mL/min/{1.73_m2} (ref >=60)
Globulin: 2.6 g/dL (calc) (ref 1.9–3.7)
Glucose, Bld: 101 mg/dL — ABNORMAL HIGH (ref 65–99)
Potassium: 4 mmol/L (ref 3.5–5.3)
Sodium: 142 mmol/L (ref 135–146)
Total Bilirubin: 0.4 mg/dL (ref 0.2–1.2)
Total Protein: 7.1 g/dL (ref 6.1–8.1)

## 2017-08-30 LAB — LIPID PANEL
Cholesterol: 164 mg/dL (ref <200)
HDL: 38 mg/dL — ABNORMAL LOW (ref >40)
LDL Cholesterol (Calc): 104 mg/dL (calc) — ABNORMAL HIGH
Non-HDL Cholesterol (Calc): 126 mg/dL (calc) (ref <130)
Total CHOL/HDL Ratio: 4.3 (calc) (ref <5.0)
Triglycerides: 127 mg/dL (ref <150)

## 2017-08-30 LAB — CBC WITH DIFFERENTIAL/PLATELET
Basophils Absolute: 77 cells/uL (ref 0–200)
Basophils Relative: 0.9 %
Eosinophils Absolute: 213 cells/uL (ref 15–500)
Eosinophils Relative: 2.5 %
HCT: 44.7 % (ref 38.5–50.0)
Hemoglobin: 14.9 g/dL (ref 13.2–17.1)
Lymphs Abs: 2652 cells/uL (ref 850–3900)
MCH: 29.7 pg (ref 27.0–33.0)
MCHC: 33.3 g/dL (ref 32.0–36.0)
MCV: 89.2 fL (ref 80.0–100.0)
MPV: 10.8 fL (ref 7.5–12.5)
Monocytes Relative: 8.6 %
Neutro Abs: 4828 cells/uL (ref 1500–7800)
Neutrophils Relative %: 56.8 %
Platelets: 263 10*3/uL (ref 140–400)
RBC: 5.01 10*6/uL (ref 4.20–5.80)
RDW: 12.2 % (ref 11.0–15.0)
Total Lymphocyte: 31.2 %
WBC mixed population: 731 cells/uL (ref 200–950)
WBC: 8.5 10*3/uL (ref 3.8–10.8)

## 2017-08-30 LAB — TSH: TSH: 1.04 mIU/L (ref 0.40–4.50)

## 2017-08-31 ENCOUNTER — Telehealth: Payer: Self-pay

## 2017-08-31 NOTE — Telephone Encounter (Signed)
-----   Message from Trey Sailors, [place] sent at 08/31/2017  1:09 PM EDT ----- Labs normal except for slightly high fasting glucose. Please work on exercise and heart healthy diet.

## 2017-08-31 NOTE — Telephone Encounter (Signed)
Pt advised.   Thanks,   -Laura  

## 2017-12-04 ENCOUNTER — Telehealth: Payer: Self-pay

## 2017-12-04 ENCOUNTER — Encounter: Payer: Self-pay | Admitting: *Deleted

## 2017-12-04 NOTE — Progress Notes (Signed)
Called no answer, not able to leave a [place] Companymessage-Lexxi Koslow V Shareeka Yim, RMA

## 2017-12-04 NOTE — Telephone Encounter (Signed)
Called patient back no answer, need to let patient know that [facility] has attempted to get in touch with patient with no success and for patient to call them, -Consuella LoseAnastasiya V Hopkins, RMA

## 2018-11-05 ENCOUNTER — Ambulatory Visit (INDEPENDENT_AMBULATORY_CARE_PROVIDER_SITE_OTHER): Payer: 59 | Admitting: Family Medicine

## 2018-11-05 ENCOUNTER — Encounter: Payer: Self-pay | Admitting: Family Medicine

## 2018-11-05 VITALS — BP 160/120 | HR 92 | Temp 98.1°F | Ht 66.0 in | Wt 241.6 lb

## 2018-11-05 DIAGNOSIS — R51 Headache: Secondary | ICD-10-CM | POA: Diagnosis not present

## 2018-11-05 DIAGNOSIS — Z13220 Encounter for screening for lipoid disorders: Secondary | ICD-10-CM

## 2018-11-05 DIAGNOSIS — R030 Elevated blood-pressure reading, without diagnosis of hypertension: Secondary | ICD-10-CM

## 2018-11-05 DIAGNOSIS — Z0000 Encounter for general adult medical examination without abnormal findings: Secondary | ICD-10-CM | POA: Diagnosis not present

## 2018-11-05 DIAGNOSIS — J069 Acute upper respiratory infection, unspecified: Secondary | ICD-10-CM

## 2018-11-05 DIAGNOSIS — Z23 Encounter for immunization: Secondary | ICD-10-CM | POA: Diagnosis not present

## 2018-11-05 DIAGNOSIS — R519 Headache, unspecified: Secondary | ICD-10-CM

## 2018-11-05 MED ORDER — METOPROLOL SUCCINATE ER 50 MG PO TB24: 50.0000 mg | ORAL_TABLET | Freq: Every day | ORAL | 0 refills | Status: DC

## 2018-11-05 NOTE — Patient Instructions (Addendum)
We will call you with the lab results. For your cold symptoms-Mucinex, Saline nasal spray, and Delsym for cough. May use Benadryl at night for post nasal drainage.Use Tylenol up to 3000 mg/day for knee pain.

## 2018-11-05 NOTE — Progress Notes (Signed)
  Subjective:     Patient ID: Shane Hayes, male   DOB: May 01, 1971, 47 y.o.   MRN: [id number] Chief Complaint  Patient presents with  . Annual Exam  . Headache    for several months now  . Knee Pain    left knee since 10/22/18   HPI States he ran out of metoprolol for chronic headache. 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 Reports his young daughter is sick and he just developed runny nose today.  Review of Systems General: Will get the flu shot today. Quit smoking in the summer. States he has a [employer] membership but has not been using regularly HEENT: No recent dental visit and is encouraged to do so.. Gets routine exams (glasses) Cardiovascular: no chest pain, shortness of breath, or palpitations GI: no heartburn, no change in bowel habits or blood in the stool GU: nocturia x 1, no change in bladder habits  Psychiatric: not depressed Musculoskeletal: States he had throbbing left knee pain for two weeks primarily with w.b.but has improved with the use of otc medication. [facility]    Objective:   Physical Exam Constitutional:      General: He is not in acute distress.    Appearance: He is well-developed.  Neurological:     Mental Status: He is alert.   Eyes: PERRLA, EOMI Neck: no thyromegaly, tenderness or nodules, no cervical adenopathy or carotid bruits ENT: TM's intact without inflammation. Unable to visualize his tonsils due to strong tongue reflex Lungs: Clear Heart : RRR without murmur or gallop Abd: bowel sounds present, soft, non-tender, no organomegaly Extremities: no edema. Left kee with FROM,no swelling or erythema. Knee ligaments stable. McMurray's test negative Skin: no atypical lesions noted on his back.     Assessment:    1. Need for influenza vaccination - Flu Vaccine QUAD 6+ mos PF IM (Fluarix Quad PF)  2. Annual physical exam  3. Lipid screening - Lipid panel  4. Frequent headache - metoprolol succinate  (TOPROL-XL) 50 MG 24 hr tablet; Take 1 tablet (50 mg total) by mouth daily. Take with or immediately following a meal.  Dispense: 90 tablet; Refill: 0  5. Elevated systolic blood pressure reading without diagnosis of hypertension: ? Due to nsaid use or headache pain  6. URI, acute:     Plan:    Further f/u pending lab results. Discussed use of cold preparation which don't increase blood pressure. Tylenol for knee pain.

## 2018-11-06 LAB — LIPID PANEL
CHOLESTEROL TOTAL: 135 mg/dL (ref 100–199)
Chol/HDL Ratio: 3.4 ratio (ref 0.0–5.0)
HDL: 40 mg/dL (ref >39)
LDL CALC: 82 mg/dL (ref 0–99)
Triglycerides: 66 mg/dL (ref 0–149)
VLDL CHOLESTEROL CAL: 13 mg/dL (ref 5–40)

## 2018-11-08 ENCOUNTER — Telehealth: Payer: Self-pay

## 2018-11-08 NOTE — Telephone Encounter (Signed)
-----   Message from Anola Gurneyobert Chauvin, [place] sent at 11/08/2018  7:17 AM EST ----- Cholesterol looks good

## 2018-11-08 NOTE — Telephone Encounter (Signed)
Attempted to contact patient on home and cell and patient was unavailable. Will try contacting patient at a later time.

## 2018-11-10 NOTE — Telephone Encounter (Signed)
Attempted to call patient on home number No answer.

## 2018-11-11 NOTE — Telephone Encounter (Signed)
Pt advised of lab results.  dbs 

## 2018-11-26 ENCOUNTER — Ambulatory Visit
Admission: RE | Admit: 2018-11-26 | Discharge: 2018-11-26 | Disposition: A | Discharge status: Home / Self Care | Payer: 59 | Source: Ambulatory Visit | Attending: Family Medicine | Admitting: Family Medicine

## 2018-11-26 ENCOUNTER — Ambulatory Visit: Payer: 59 | Admitting: Family Medicine

## 2018-11-26 ENCOUNTER — Ambulatory Visit
Admission: RE | Admit: 2018-11-26 | Discharge: 2018-11-26 | Disposition: A | Discharge status: Home / Self Care | Payer: 59 | Attending: Family Medicine | Admitting: Family Medicine

## 2018-11-26 VITALS — BP 148/92 | HR 78 | Temp 98.6°F | Resp 16 | Wt 244.0 lb

## 2018-11-26 DIAGNOSIS — M25562 Pain in left knee: Secondary | ICD-10-CM | POA: Insufficient documentation

## 2018-11-26 DIAGNOSIS — G8929 Other chronic pain: Secondary | ICD-10-CM

## 2018-11-26 DIAGNOSIS — M25569 Pain in unspecified knee: Secondary | ICD-10-CM | POA: Diagnosis not present

## 2018-11-26 DIAGNOSIS — R51 Headache: Secondary | ICD-10-CM | POA: Diagnosis not present

## 2018-11-26 DIAGNOSIS — R030 Elevated blood-pressure reading, without diagnosis of hypertension: Secondary | ICD-10-CM

## 2018-11-26 DIAGNOSIS — M1712 Unilateral primary osteoarthritis, left knee: Secondary | ICD-10-CM | POA: Diagnosis not present

## 2018-11-26 DIAGNOSIS — R519 Headache, unspecified: Secondary | ICD-10-CM

## 2018-11-26 MED ORDER — METOPROLOL SUCCINATE ER 100 MG PO TB24: 100.0000 mg | ORAL_TABLET | Freq: Every day | ORAL | 0 refills | Status: DC

## 2018-11-26 NOTE — Patient Instructions (Signed)
We will call you with the x-ray results. 

## 2018-11-26 NOTE — Progress Notes (Signed)
  Subjective:     Patient ID: Shane Hayes, male   DOB: October 01, 1971, 48 y.o.   MRN: [id number] Chief Complaint  Patient presents with  . Hypertension    Pt had elevated BP during employment PE  . Knee Pain    Continued left knee pain   HPI Reports headaches have improved after restarting metoprolol. He has chronic throbbing left knee pain both with w.b. and n.w.b.and wishes further evaluation. No specific injury but relates it to working on concrete floors.  Review of Systems     Objective:   Physical Exam Constitutional:      General: He is not in acute distress.    Appearance: He is not ill-appearing.  Cardiovascular:     Rate and Rhythm: Normal rate and regular rhythm.  Pulmonary:     Breath sounds: Normal breath sounds.  Musculoskeletal:     Comments: Left knee without swelling,erythema, or specific areas of tenderness. KF/KE 5/5.  Neurological:     Mental Status: He is alert.        Assessment:    1. Frequent headaches: improved  2. Elevated systolic blood pressure reading without diagnosis of hypertension: increases metoprolol to 100 mg.  3. Chronic pain of left knee - DG Knee Complete 4 Views Left; Future    Plan:   Further f/u pending x-ray results.

## 2018-11-29 ENCOUNTER — Other Ambulatory Visit: Payer: Self-pay | Admitting: Family Medicine

## 2018-11-29 DIAGNOSIS — M25562 Pain in left knee: Principal | ICD-10-CM

## 2018-11-29 DIAGNOSIS — G8929 Other chronic pain: Secondary | ICD-10-CM

## 2018-12-06 ENCOUNTER — Ambulatory Visit: Payer: Self-pay | Admitting: Orthopedic Surgery

## 2018-12-06 DIAGNOSIS — M1712 Unilateral primary osteoarthritis, left knee: Secondary | ICD-10-CM | POA: Diagnosis not present

## 2018-12-17 ENCOUNTER — Encounter: Payer: Self-pay | Admitting: Family Medicine

## 2018-12-17 ENCOUNTER — Other Ambulatory Visit: Payer: Self-pay

## 2018-12-17 ENCOUNTER — Ambulatory Visit: Payer: 59 | Admitting: Family Medicine

## 2018-12-17 VITALS — BP 144/102 | HR 87 | Temp 98.0°F | Ht 65.0 in | Wt 242.2 lb

## 2018-12-17 DIAGNOSIS — R030 Elevated blood-pressure reading, without diagnosis of hypertension: Secondary | ICD-10-CM | POA: Diagnosis not present

## 2018-12-17 DIAGNOSIS — G8929 Other chronic pain: Secondary | ICD-10-CM

## 2018-12-17 DIAGNOSIS — M25562 Pain in left knee: Secondary | ICD-10-CM | POA: Diagnosis not present

## 2018-12-17 NOTE — Progress Notes (Signed)
  Subjective:     Patient ID: WAYMAN HEGGEN, male   DOB: [birth year], 48 y.o.   MRN: [id number] Chief Complaint  Patient presents with  . Follow-up    HTN, knee pain   HPI He was evaluated by orthopedics for knee osteoarthritis. Treated with meloxicam, knee brace and physical therapy. States he has not started physical therapy and wishes to try going back to the gym for a week first. He has not used the brace for a couple of days. Reports compliance with increased dose of metoprolol. ? Effect of meloxicam on bp. He originally was on beta blocker for headache control.  Review of Systems     Objective:   Physical Exam Constitutional:      General: He is not in acute distress. Cardiovascular:     Rate and Rhythm: Normal rate and regular rhythm.  Pulmonary:     Effort: Pulmonary effort is normal.     Breath sounds: Normal breath sounds.  Musculoskeletal:     Right lower leg: No edema.     Left lower leg: No edema.  Neurological:     Mental Status: He is alert.        Assessment:    1. Elevated systolic blood pressure reading without diagnosis of hypertension: continue current dose of medication pending completion of meloxicam  2. Chronic pain of left knee: per orthopedics   Plan:    Will recheck when off meloxicam in a few weeks.

## 2018-12-17 NOTE — Patient Instructions (Signed)
Do start physical therapy if your knee is not improving.

## 2019-01-14 ENCOUNTER — Ambulatory Visit: Payer: 59 | Admitting: Family Medicine

## 2019-01-21 ENCOUNTER — Encounter: Payer: Self-pay | Admitting: Family Medicine

## 2019-01-21 ENCOUNTER — Ambulatory Visit: Payer: 59 | Admitting: Family Medicine

## 2019-01-21 VITALS — BP 134/100 | HR 69 | Temp 97.6°F | Resp 16 | Wt 243.4 lb

## 2019-01-21 DIAGNOSIS — I10 Essential (primary) hypertension: Secondary | ICD-10-CM

## 2019-01-21 DIAGNOSIS — G8929 Other chronic pain: Secondary | ICD-10-CM

## 2019-01-21 DIAGNOSIS — M25562 Pain in left knee: Secondary | ICD-10-CM

## 2019-01-21 MED ORDER — HYDROCHLOROTHIAZIDE 25 MG PO TABS: 25.0000 mg | ORAL_TABLET | Freq: Every day | ORAL | 1 refills | Status: DC

## 2019-01-21 NOTE — Patient Instructions (Signed)
You should get a call about the physical therapy appointment.

## 2019-01-21 NOTE — Progress Notes (Signed)
  Subjective:     Patient ID: Shane Hayes, male   DOB: 1971-10-04, 48 y.o.   MRN: [id number] Chief Complaint  Patient presents with  . Hypertension    Patient returns to office today for follow up after being seen on 12/17/18. At last visit patients blood pressure was 144/102, he is not checking his blood pressure outside the office but reports good compliance on medication.    HPI He reports he is no longer on meloxicam. Also wants referral to physical therapy for his left knee pain as recommended by orthopedics. Admits to some duress at home with his spouse.  Review of Systems     Objective:   Physical Exam Constitutional:      General: He is not in acute distress. Cardiovascular:     Rate and Rhythm: Regular rhythm. Bradycardia present.  Pulmonary:     Breath sounds: Normal breath sounds.  Musculoskeletal:     Right lower leg: No edema.     Left lower leg: No edema.  Neurological:     Mental Status: He is alert.        Assessment:    1. Essential hypertension: continue metoprolol - hydrochlorothiazide (HYDRODIURIL) 25 MG tablet; Take 1 tablet (25 mg total) by mouth daily.  Dispense: 30 tablet; Refill: 1  2. Chronic pain of left knee - Ambulatory referral to Physical Therapy    Plan:    He will follow up with a new provider in 4 weeks.

## 2019-02-18 ENCOUNTER — Ambulatory Visit: Payer: 59 | Admitting: Physician Assistant

## 2019-02-18 ENCOUNTER — Encounter: Payer: Self-pay | Admitting: Physician Assistant

## 2019-02-18 ENCOUNTER — Other Ambulatory Visit: Payer: Self-pay

## 2019-02-18 VITALS — BP 136/89 | HR 85 | Temp 97.8°F | Resp 16 | Wt 237.6 lb

## 2019-02-18 DIAGNOSIS — G8929 Other chronic pain: Secondary | ICD-10-CM

## 2019-02-18 DIAGNOSIS — I10 Essential (primary) hypertension: Secondary | ICD-10-CM | POA: Insufficient documentation

## 2019-02-18 DIAGNOSIS — M25562 Pain in left knee: Secondary | ICD-10-CM | POA: Diagnosis not present

## 2019-02-18 DIAGNOSIS — R739 Hyperglycemia, unspecified: Secondary | ICD-10-CM | POA: Diagnosis not present

## 2019-02-18 MED ORDER — MELOXICAM 7.5 MG PO TABS: ORAL_TABLET | ORAL | 0 refills | Status: DC

## 2019-02-18 NOTE — Patient Instructions (Signed)

## 2019-02-18 NOTE — Progress Notes (Signed)
Patient: Shane Hayes Male    DOB: [birth year]   48 y.o.   MRN: [id number] Visit Date: 02/18/2019  Today's Provider: Trey Sailors, PA-C   Chief Complaint  Patient presents with  . Follow-up    HTN   Subjective:     HPI   Hypertension, follow-up:  BP Readings from Last 3 Encounters:  02/18/19 136/89  01/21/19 (!) 134/100  12/17/18 (!) 144/102    He was last seen for hypertension 4 weeks ago.  BP at that visit was 134/100 Management since that visit includes adding HCTZ 25 mg daily to metprolol succinate 100 mg daily.  He reports excellent compliance with treatment. He is not having side effects.  He is not exercising. He is adherent to low salt diet.  Reports that he has cut down on the salt a lot. Outside blood pressures are n/a. He is experiencing none.  Patient denies chest pain, chest pressure/discomfort, exertional chest pressure/discomfort, fatigue, irregular heart beat, lower extremity edema, near-syncope and palpitations.   Cardiovascular risk factors include hypertension.     Weight trend: stable Wt Readings from Last 3 Encounters:  02/18/19 237 lb 9.6 oz (107.8 kg)  01/21/19 243 lb 6.4 oz (110.4 kg)  12/17/18 242 lb 3.2 oz (109.9 kg)    Current diet: in general, an "unhealthy" diet  Reports he was referred to [facility] for left knee pain and arthritis. Dr. Martha Clan recommended PT which [name], PA-C referred but the referral didn't go through because [name] has since retired. Continues with knee pain today.  ------------------------------------------------------------------------   No Known Allergies   Current Outpatient Medications:  .  fluticasone (FLONASE) 50 MCG/ACT nasal spray, Place 2 sprays into both nostrils daily., Disp: 16 g, Rfl: 6 .  hydrochlorothiazide (HYDRODIURIL) 25 MG tablet, Take 1 tablet (25 mg total) by mouth daily., Disp: 30 tablet, Rfl: 1 .  metoprolol succinate (TOPROL-XL) 100 MG 24 hr tablet, Take 1 tablet  (100 mg total) by mouth daily. Take with or immediately following a meal., Disp: 90 tablet, Rfl: 0 .  Multiple Vitamin (MULTIVITAMIN) tablet, Take 1 tablet by mouth daily., Disp: , Rfl:   Review of Systems  Respiratory: Positive for cough. Negative for chest tightness and shortness of breath.     Social History   Tobacco Use  . Smoking status: Former Smoker    Types: Cigars    Last attempt to quit: 05/2018    Years since quitting: 0.7  . Smokeless tobacco: Never Used  Substance Use Topics  . Alcohol use: No    Alcohol/week: 0.0 standard drinks      Objective:   BP 136/89 (BP Location: Left Arm, Patient Position: Sitting, Cuff Size: Large)   Pulse 85   Temp 97.8 F (36.6 C) (Oral)   Resp 16   Wt 237 lb 9.6 oz (107.8 kg)   BMI 39.54 kg/m  Vitals:   02/18/19 0807  BP: 136/89  Pulse: 85  Resp: 16  Temp: 97.8 F (36.6 C)  TempSrc: Oral  Weight: 237 lb 9.6 oz (107.8 kg)     Physical Exam Constitutional:      Appearance: Normal appearance.  Cardiovascular:     Rate and Rhythm: Normal rate and regular rhythm.  Pulmonary:     Effort: Pulmonary effort is normal.     Breath sounds: Normal breath sounds.  Skin:    General: Skin is warm and dry.  Neurological:     Mental Status: He  is alert. Mental status is at baseline.  Psychiatric:        Mood and Affect: Mood normal.        Behavior: Behavior normal.         Assessment & Plan    1. Essential hypertension  High normal but improved. Labs as below, will check at his physical in October and adjust as necessary.  - Comprehensive Metabolic Panel (CMET) - CBC with Differential - TSH  2. Chronic pain of left knee  Anti-inflammatories as below, encouraged to use sparingly and alternate with tylenol. Placed another referral for physical therapy.  - meloxicam (MOBIC) 7.5 MG tablet; Take 1 - 2 tablets daily as needed.  Dispense: 30 tablet; Refill: 0 - Ambulatory referral to Physical Therapy  The entirety of  the information documented in the History of Present Illness, Review of Systems and Physical Exam were personally obtained by me. Portions of this information were initially documented by Hetty Ely, CMA and reviewed by me for thoroughness and accuracy.    F/u 6 mo for CPE.      Trey Sailors, PA-C  [facility]

## 2019-02-19 LAB — CBC WITH DIFFERENTIAL/PLATELET
Basophils Absolute: 0.1 10*3/uL (ref 0.0–0.2)
Basos: 1 %
EOS (ABSOLUTE): 0.4 10*3/uL (ref 0.0–0.4)
Eos: 4 %
Hematocrit: 42.9 % (ref 37.5–51.0)
Hemoglobin: 14.2 g/dL (ref 13.0–17.7)
Immature Grans (Abs): 0 10*3/uL (ref 0.0–0.1)
Immature Granulocytes: 0 %
Lymphocytes Absolute: 2.9 10*3/uL (ref 0.7–3.1)
Lymphs: 33 %
MCH: 29.7 pg (ref 26.6–33.0)
MCHC: 33.1 g/dL (ref 31.5–35.7)
MCV: 90 fL (ref 79–97)
Monocytes Absolute: 0.9 10*3/uL (ref 0.1–0.9)
Monocytes: 11 %
Neutrophils Absolute: 4.4 10*3/uL (ref 1.4–7.0)
Neutrophils: 51 %
Platelets: 310 10*3/uL (ref 150–450)
RBC: 4.78 x10E6/uL (ref 4.14–5.80)
RDW: 12.3 % (ref 11.6–15.4)
WBC: 8.7 10*3/uL (ref 3.4–10.8)

## 2019-02-19 LAB — COMPREHENSIVE METABOLIC PANEL
ALT: 30 IU/L (ref 0–44)
AST: 24 IU/L (ref 0–40)
Albumin/Globulin Ratio: 2.2 (ref 1.2–2.2)
Albumin: 4.6 g/dL (ref 4.0–5.0)
Alkaline Phosphatase: 69 IU/L (ref 39–117)
BUN/Creatinine Ratio: 13 (ref 9–20)
BUN: 17 mg/dL (ref 6–24)
Bilirubin Total: 0.3 mg/dL (ref 0.0–1.2)
CO2: 24 mmol/L (ref 20–29)
Calcium: 10.5 mg/dL — ABNORMAL HIGH (ref 8.7–10.2)
Chloride: 98 mmol/L (ref 96–106)
Creatinine, Ser: 1.3 mg/dL — ABNORMAL HIGH (ref 0.76–1.27)
GFR calc Af Amer: 75 mL/min/{1.73_m2} (ref >59)
GFR calc non Af Amer: 65 mL/min/{1.73_m2} (ref >59)
Globulin, Total: 2.1 g/dL (ref 1.5–4.5)
Glucose: 121 mg/dL — ABNORMAL HIGH (ref 65–99)
Potassium: 4.1 mmol/L (ref 3.5–5.2)
Sodium: 137 mmol/L (ref 134–144)
Total Protein: 6.7 g/dL (ref 6.0–8.5)

## 2019-02-19 LAB — TSH: TSH: 1.17 u[IU]/mL (ref 0.450–4.500)

## 2019-02-22 ENCOUNTER — Telehealth: Payer: Self-pay

## 2019-02-22 NOTE — Telephone Encounter (Signed)
-----   Message from Adriana M Pollak, PA-C sent at 02/22/2019  8:28 AM EDT ----- His kidney function is a little lower, he should really make concerted effort to limit the meloxicam, ibuprofen and naproxen use which can worsen kidney function. Try as much as possible to use tylenol. Can we please add on a1c under dx hyperglycemia? Sugar was a little high, thank you. 

## 2019-02-22 NOTE — Telephone Encounter (Signed)
Tried to call patient regarding lab results. Unable to leave a message. A1C added to labs this morning.

## 2019-02-23 LAB — SPECIMEN STATUS REPORT

## 2019-02-23 LAB — HGB A1C W/O EAG: Hgb A1c MFr Bld: 5.3 % (ref 4.8–5.6)

## 2019-02-23 NOTE — Telephone Encounter (Signed)
Na

## 2019-02-23 NOTE — Telephone Encounter (Signed)
-----   Message from Trey Sailors, [place] sent at 02/22/2019  8:28 AM EDT ----- His kidney function is a little lower, he should really make concerted effort to limit the meloxicam, ibuprofen and naproxen use which can worsen kidney function. Try as much as possible to use tylenol. Can we please add on a1c under dx hyperglycemia? Sugar was a little high, thank you.

## 2019-02-25 DIAGNOSIS — M25562 Pain in left knee: Secondary | ICD-10-CM | POA: Diagnosis not present

## 2019-03-01 NOTE — Telephone Encounter (Signed)
Patient was advised by another CMA about his labs results.Documentation is in his labs report

## 2019-09-16 ENCOUNTER — Ambulatory Visit: Payer: 59 | Admitting: Physician Assistant

## 2019-09-16 ENCOUNTER — Encounter: Payer: Self-pay | Admitting: Physician Assistant

## 2019-09-16 ENCOUNTER — Other Ambulatory Visit: Payer: Self-pay | Admitting: Physician Assistant

## 2019-09-16 ENCOUNTER — Other Ambulatory Visit: Payer: Self-pay

## 2019-09-16 VITALS — BP 129/92 | HR 81 | Temp 96.9°F | Resp 16 | Wt 232.0 lb

## 2019-09-16 DIAGNOSIS — Z6838 Body mass index (BMI) 38.0-38.9, adult: Secondary | ICD-10-CM | POA: Diagnosis not present

## 2019-09-16 DIAGNOSIS — Z0000 Encounter for general adult medical examination without abnormal findings: Secondary | ICD-10-CM

## 2019-09-16 DIAGNOSIS — Z131 Encounter for screening for diabetes mellitus: Secondary | ICD-10-CM | POA: Diagnosis not present

## 2019-09-16 DIAGNOSIS — I10 Essential (primary) hypertension: Secondary | ICD-10-CM | POA: Diagnosis not present

## 2019-09-16 DIAGNOSIS — E669 Obesity, unspecified: Secondary | ICD-10-CM

## 2019-09-16 MED ORDER — HYDROCHLOROTHIAZIDE 25 MG PO TABS: 25.0000 mg | ORAL_TABLET | Freq: Every day | ORAL | 1 refills | Status: DC

## 2019-09-16 MED ORDER — METOPROLOL SUCCINATE ER 100 MG PO TB24: 100.0000 mg | ORAL_TABLET | Freq: Every day | ORAL | 0 refills | Status: DC

## 2019-09-16 NOTE — Patient Instructions (Signed)

## 2019-09-16 NOTE — Progress Notes (Signed)
Patient: Shane Hayes Male    DOB: 1971/04/01   48 y.o.   MRN: [id number] Visit Date: 09/16/2019  Today's Provider: Trey Sailors, PA-C   Chief Complaint  Patient presents with  . Hypertension   Subjective:     HPI  Hypertension, follow-up:  BP Readings from Last 3 Encounters:  09/16/19 (!) 129/92  02/18/19 136/89  01/21/19 (!) 134/100    He was last seen for hypertension 7 months ago.  BP at that visit was 136/89. Management changes since that visit include check labs. He reports poor compliance with treatment. He is not taking any of his blood pressure medications and has not for the past two months. He wants to come off them. He does not check his blood pressure at home. He has lost 11 lbs since 12/2018.  He is not having side effects.  He is not exercising. He is adherent to low salt diet.   Outside blood pressures are not being checked. He is experiencing none.  Patient denies chest pain.   Cardiovascular risk factors include hypertension and obesity (BMI >= 30 kg/m2).  Use of agents associated with hypertension: none.     Weight trend: Decreasing weight.  Wt Readings from Last 3 Encounters:  09/16/19 232 lb (105.2 kg)  02/18/19 237 lb 9.6 oz (107.8 kg)  01/21/19 243 lb 6.4 oz (110.4 kg)    Current diet: in general, a "healthy" diet    Not due for colon cancer screening. No family history of prostate cancer.  ------------------------------------------------------------------------   No Known Allergies   Current Outpatient Medications:  .  fluticasone (FLONASE) 50 MCG/ACT nasal spray, Place 2 sprays into both nostrils daily., Disp: 16 g, Rfl: 6 .  Multiple Vitamin (MULTIVITAMIN) tablet, Take 1 tablet by mouth daily., Disp: , Rfl:  .  hydrochlorothiazide (HYDRODIURIL) 25 MG tablet, Take 1 tablet (25 mg total) by mouth daily. (Patient not taking: Reported on 09/16/2019), Disp: 30 tablet, Rfl: 1 .  metoprolol succinate (TOPROL-XL) 100 MG 24 hr  tablet, Take 1 tablet (100 mg total) by mouth daily. Take with or immediately following a meal. (Patient not taking: Reported on 09/16/2019), Disp: 90 tablet, Rfl: 0  Review of Systems  Constitutional: Negative.   Respiratory: Negative.   Cardiovascular: Negative.     Social History   Tobacco Use  . Smoking status: Former Smoker    Types: Cigars    Quit date: 05/2018    Years since quitting: 1.3  . Smokeless tobacco: Never Used  Substance Use Topics  . Alcohol use: No    Alcohol/week: 0.0 standard drinks      Objective:   BP (!) 129/92 (BP Location: Right Arm, Patient Position: Sitting, Cuff Size: Large)   Pulse 81   Temp (!) 96.9 F (36.1 C) (Temporal)   Resp 16   Wt 232 lb (105.2 kg)   BMI 38.61 kg/m  Vitals:   09/16/19 1040 09/16/19 1045  BP: (!) 133/93 (!) 129/92  Pulse: 83 81  Resp: 16   Temp: (!) 96.9 F (36.1 C)   TempSrc: Temporal   Weight: 232 lb (105.2 kg)   Body mass index is 38.61 kg/m.   Physical Exam Constitutional:      Appearance: Normal appearance.  Cardiovascular:     Rate and Rhythm: Normal rate and regular rhythm.     Heart sounds: Normal heart sounds.  Pulmonary:     Effort: Pulmonary effort is normal.  Breath sounds: Normal breath sounds.  Skin:    General: Skin is warm and dry.  Neurological:     Mental Status: He is alert and oriented to person, place, and time. Mental status is at baseline.  Psychiatric:        Mood and Affect: Mood normal.        Behavior: Behavior normal.      No results found for any visits on 09/16/19.     Assessment & Plan    1. Annual physical exam  Labs as below. Will fill out work form for physical if needed.   2. Essential hypertension  Not taking his medications, blood pressure remains uncontrolled. Recommend he resume his medications. However, his BP is reduced and he has lost 11 lbs. If he continues to lose weight I suspect he can stop his blood pressure medications.   - Comprehensive  Metabolic Panel (CMET) - Lipid Profile - hydrochlorothiazide (HYDRODIURIL) 25 MG tablet; Take 1 tablet (25 mg total) by mouth daily.  Dispense: 30 tablet; Refill: 1 - metoprolol succinate (TOPROL-XL) 100 MG 24 hr tablet; Take 1 tablet (100 mg total) by mouth daily. Take with or immediately following a meal.  Dispense: 90 tablet; Refill: 0  3. Class 2 obesity without serious comorbidity with body mass index (BMI) of 38.0 to 38.9 in adult, unspecified obesity type  Keep up weight loss.   4. Diabetes mellitus screening  - HgB A1c  The entirety of the information documented in the History of Present Illness, Review of Systems and Physical Exam were personally obtained by me. Portions of this information were initially documented by Lynford Humphrey, CMA and reviewed by me for thoroughness and accuracy.      Trinna Post, PA-C  [facility]

## 2019-09-17 LAB — COMPREHENSIVE METABOLIC PANEL
ALT: 36 IU/L (ref 0–44)
AST: 23 IU/L (ref 0–40)
Albumin/Globulin Ratio: 2 (ref 1.2–2.2)
Albumin: 4.6 g/dL (ref 4.0–5.0)
Alkaline Phosphatase: 71 IU/L (ref 39–117)
BUN/Creatinine Ratio: 12 (ref 9–20)
BUN: 17 mg/dL (ref 6–24)
Bilirubin Total: 0.4 mg/dL (ref 0.0–1.2)
CO2: 23 mmol/L (ref 20–29)
Calcium: 9.9 mg/dL (ref 8.7–10.2)
Chloride: 107 mmol/L — ABNORMAL HIGH (ref 96–106)
Creatinine, Ser: 1.38 mg/dL — ABNORMAL HIGH (ref 0.76–1.27)
GFR calc Af Amer: 69 mL/min/{1.73_m2} (ref >59)
GFR calc non Af Amer: 60 mL/min/{1.73_m2} (ref >59)
Globulin, Total: 2.3 g/dL (ref 1.5–4.5)
Glucose: 89 mg/dL (ref 65–99)
Potassium: 4.1 mmol/L (ref 3.5–5.2)
Sodium: 143 mmol/L (ref 134–144)
Total Protein: 6.9 g/dL (ref 6.0–8.5)

## 2019-09-17 LAB — LIPID PANEL
Chol/HDL Ratio: 3.4 ratio (ref 0.0–5.0)
Cholesterol, Total: 131 mg/dL (ref 100–199)
HDL: 39 mg/dL — ABNORMAL LOW (ref >39)
LDL Chol Calc (NIH): 78 mg/dL (ref 0–99)
Triglycerides: 68 mg/dL (ref 0–149)
VLDL Cholesterol Cal: 14 mg/dL (ref 5–40)

## 2019-09-17 LAB — HEMOGLOBIN A1C
Est. average glucose Bld gHb Est-mCnc: 103 mg/dL
Hgb A1c MFr Bld: 5.2 % (ref 4.8–5.6)

## 2019-09-20 ENCOUNTER — Telehealth: Payer: Self-pay

## 2019-09-20 DIAGNOSIS — N289 Disorder of kidney and ureter, unspecified: Secondary | ICD-10-CM

## 2019-09-20 NOTE — Telephone Encounter (Signed)
-----   Message from Trinna Post, [place] sent at 09/20/2019  8:22 AM EDT ----- Kidney function is slightly lower than previous check. Sometimes this can be due to dehydration, over the counter supplements. I recommend he push fluids and stop any supplements he is taking and recheck this in a month. Can place CMET order please and he can get this on walk in basis.

## 2019-09-20 NOTE — Telephone Encounter (Signed)
Patient was advised and states that he will return to have labs redrawn.

## 2019-11-07 ENCOUNTER — Other Ambulatory Visit: Payer: Self-pay | Admitting: Physician Assistant

## 2019-11-07 DIAGNOSIS — I10 Essential (primary) hypertension: Secondary | ICD-10-CM

## 2019-11-07 MED ORDER — METOPROLOL SUCCINATE ER 100 MG PO TB24: 100.0000 mg | ORAL_TABLET | Freq: Every day | ORAL | 0 refills | Status: DC

## 2019-11-07 NOTE — Telephone Encounter (Signed)
[facility] faxed refill request for the following medications:  metoprolol succinate (TOPROL-XL) 100 MG 24 hr tablet   Please advise.  Thanks, [employer]

## 2019-11-11 ENCOUNTER — Other Ambulatory Visit: Payer: Self-pay

## 2019-11-11 ENCOUNTER — Ambulatory Visit (INDEPENDENT_AMBULATORY_CARE_PROVIDER_SITE_OTHER): Payer: 59 | Admitting: Physician Assistant

## 2019-11-11 VITALS — BP 130/88 | HR 95 | Temp 97.5°F | Ht 65.5 in | Wt 238.0 lb

## 2019-11-11 DIAGNOSIS — I10 Essential (primary) hypertension: Secondary | ICD-10-CM | POA: Diagnosis not present

## 2019-11-11 MED ORDER — METOPROLOL SUCCINATE ER 100 MG PO TB24: 100.0000 mg | ORAL_TABLET | Freq: Every day | ORAL | 0 refills | Status: DC

## 2019-11-11 MED ORDER — HYDROCHLOROTHIAZIDE 25 MG PO TABS: 25.0000 mg | ORAL_TABLET | Freq: Every day | ORAL | 1 refills | Status: DC

## 2019-11-11 NOTE — Progress Notes (Signed)
Patient: Shane Hayes, Male    DOB: [birth year], 48 y.o.   MRN: [id number] Visit Date: 11/11/2019  Today's Provider: Trey Sailors, PA-C   Chief Complaint  Patient presents with  . Annual Exam   Subjective:  Shane Hayes is a 48 y.o. male who presents today for followup. He feels well. He reports exercising none. He reports he is sleeping well.   HTN: He currently takes amlodipine 5 mg QD and metoprolol succinate 100 mg QD. He has been out of metoprolol x 2 weeks. Reports he is on this for HTN and also migraine prophylaxis. Previously on Elavil for migraines prior to metoprolol.  BP Readings from Last 3 Encounters:  11/11/19 130/88  09/16/19 (!) 129/92  02/18/19 136/89   Obesity: Weight up 6 lbs from last visit.   Wt Readings from Last 3 Encounters:  11/11/19 238 lb (108 kg)  09/16/19 232 lb (105.2 kg)  02/18/19 237 lb 9.6 oz (107.8 kg)   Elevated Scr: increased over the past year from  Baseline. Patient says he uses excedrin migraine 2x weekly but does not use ibuprofen or aleve. Reports he is eating and drinking normally.   CMP Latest Ref Rng & Units 09/16/2019 02/18/2019 08/28/2017  Glucose 65 - 99 mg/dL 89 762(G) 315(V)  BUN 6 - 24 mg/dL 17 17 13   Creatinine 0.76 - 1.27 mg/dL ) 7.61(Y) 0.73(X  Sodium 134 - 144 mmol/L 143 137 142  Potassium 3.5 - 5.2 mmol/L 4.1 4.1 4.0  Chloride 96 - 106 mmol/L 107(H) 98 105  CO2 20 - 29 mmol/L 23 24 21   Calcium 8.7 - 10.2 mg/dL 9.9 10.5(H) 10.2  Total Protein 6.0 - 8.5 g/dL 6.9 6.7 7.1  Total Bilirubin 0.0 - 1.2 mg/dL 0.4 0.3 0.4  Alkaline Phos 39 - 117 IU/L 71 69 -  AST 0 - 40 IU/L 23 24 19   ALT 0 - 44 IU/L 36 30 26      Immunization History  Administered Date(s) Administered  . Influenza,inj,Quad PF,6+ Mos 08/28/2017, 11/05/2018  . Tdap 12/23/2013   Review of Systems  Constitutional: Negative.   HENT: Negative.   Eyes: Negative.   Respiratory: Negative.   Cardiovascular: Negative.   Gastrointestinal: Negative.    Endocrine: Negative.   Genitourinary: Negative.   Musculoskeletal: Negative.   Skin: Negative.   Allergic/Immunologic: Negative.   Neurological: Negative.   Hematological: Negative.   Psychiatric/Behavioral: Negative.     Social History   Socioeconomic History  . Marital status: Divorced    Spouse name: Not on file  . Number of children: Not on file  . Years of education: Not on file  . Highest education level: Not on file  Occupational History  . Not on file  Tobacco Use  . Smoking status: Former Smoker    Types: Cigars    Quit date: 05/2018    Years since quitting: 1.4  . Smokeless tobacco: Never Used  Substance and Sexual Activity  . Alcohol use: No    Alcohol/week: 0.0 standard drinks  . Drug use: No  . Sexual activity: Yes    Birth control/protection: None  Other Topics Concern  . Not on file  Social History Narrative  . Not on file   Social Determinants of Health   Financial Resource Strain:   . Difficulty of Paying Living Expenses: Not on file  Food Insecurity:   . Worried About 11/07/2018 in the Last Year: Not on file  . Ran Out of  Food in the Last Year: Not on file  Transportation Needs:   . Lack of Transportation (Medical): Not on file  . Lack of Transportation (Non-Medical): Not on file  Physical Activity:   . Days of Exercise per Week: Not on file  . Minutes of Exercise per Session: Not on file  Stress:   . Feeling of Stress : Not on file  Social Connections:   . Frequency of Communication with Friends and Family: Not on file  . Frequency of Social Gatherings with Friends and Family: Not on file  . Attends Religious Services: Not on file  . Active Member of Clubs or Organizations: Not on file  . Attends BankerClub or Organization Meetings: Not on file  . Marital Status: Not on file  Intimate Partner Violence:   . Fear of Current or Ex-Partner: Not on file  . Emotionally Abused: Not on file  . Physically Abused: Not on file  . Sexually  Abused: Not on file    Patient Active Problem List   Diagnosis Date Noted  . Essential hypertension 02/18/2019  . Frequent headaches 02/12/2016  . Allergic rhinitis 08/03/2015  . History of decompression of median nerve 08/03/2015  . H/O erectile dysfunction 08/03/2015    Past Surgical History:  Procedure Laterality Date  . CARPAL TUNNEL RELEASE Left 2013    His family history includes Congestive Heart Failure in his maternal grandmother; Healthy in his daughter, daughter, mother, sister, and son.     Outpatient Encounter Medications as of 11/11/2019  Medication Sig Note  . fluticasone (FLONASE) 50 MCG/ACT nasal spray Place 2 sprays into both nostrils daily.   . hydrochlorothiazide (HYDRODIURIL) 25 MG tablet Take 1 tablet (25 mg total) by mouth daily.   . metoprolol succinate (TOPROL-XL) 100 MG 24 hr tablet Take 1 tablet (100 mg total) by mouth daily. Take with or immediately following a meal. 11/11/2019: Has not gotten his refill, has been off of it for a few days  . Multiple Vitamin (MULTIVITAMIN) tablet Take 1 tablet by mouth daily.    No facility-administered encounter medications on file as of 11/11/2019.    Patient Care Team: Maryella ShiversPollak, Ramces Shomaker M, PA-C as PCP - General (Physician Assistant)      Objective:   Vitals:  Vitals:   11/11/19 0915  BP: 130/88  Pulse: 95  Temp: (!) 97.5 F (36.4 C)  TempSrc: Skin  SpO2: 95%  Weight: 238 lb (108 kg)  Height: 5' 5.5" (1.664 m)    Physical Exam Constitutional:      Appearance: Normal appearance. He is normal weight.  HENT:     Head: Normocephalic and atraumatic.  Cardiovascular:     Rate and Rhythm: Normal rate and regular rhythm.     Pulses: Normal pulses.     Heart sounds: Normal heart sounds.  Pulmonary:     Effort: Pulmonary effort is normal.     Breath sounds: Normal breath sounds.  Musculoskeletal:     Cervical back: Normal range of motion and neck supple.  Skin:    General: Skin is warm and dry.   Neurological:     General: No focal deficit present.     Mental Status: He is alert and oriented to person, place, and time. Mental status is at baseline.  Psychiatric:        Mood and Affect: Mood normal.        Behavior: Behavior normal.        Thought Content: Thought content normal.  Judgment: Judgment normal.    Fall Risk  11/11/2019 12/17/2018 11/05/2018 08/28/2017  Falls in the past year? 0 0 0 No  Number falls in past yr: 0 - - -  Injury with Fall? 0 - - -   Depression Screen PHQ 2/9 Scores 11/11/2019 12/17/2018 11/05/2018 08/28/2017  PHQ - 2 Score 0 0 0 0  PHQ- 9 Score - - - 2   Functional Status Survey: Is the patient deaf or have difficulty hearing?: No Does the patient have difficulty seeing, even when wearing glasses/contacts?: No Does the patient have difficulty concentrating, remembering, or making decisions?: No Does the patient have difficulty walking or climbing stairs?: No Does the patient have difficulty dressing or bathing?: No Does the patient have difficulty doing errands alone such as visiting a doctor's office or shopping?: No    Office Visit from 11/11/2019 in [place]  AUDIT-C Score  0      Assessment & Plan:   1. Essential hypertension  Recheck kidney function. Possible AKI or HTN kidney disease. Continue medications as prescribed. Follow up in 02/2019.  - metoprolol succinate (TOPROL-XL) 100 MG 24 hr tablet; Take 1 tablet (100 mg total) by mouth daily. Take with or immediately following a meal.  Dispense: 90 tablet; Refill: 0 - hydrochlorothiazide (HYDRODIURIL) 25 MG tablet; Take 1 tablet (25 mg total) by mouth daily.  Dispense: 90 tablet; Refill: 1 - Comprehensive Metabolic Panel (CMET)  Discussed health benefits of physical activity, and encouraged him to engage in regular exercise appropriate for his age and condition.

## 2019-11-11 NOTE — Patient Instructions (Signed)

## 2019-11-12 LAB — COMPREHENSIVE METABOLIC PANEL
ALT: 31 IU/L (ref 0–44)
AST: 25 IU/L (ref 0–40)
Albumin/Globulin Ratio: 2 (ref 1.2–2.2)
Albumin: 4.6 g/dL (ref 4.0–5.0)
Alkaline Phosphatase: 77 IU/L (ref 39–117)
BUN/Creatinine Ratio: 22 — ABNORMAL HIGH (ref 9–20)
BUN: 23 mg/dL (ref 6–24)
Bilirubin Total: 0.6 mg/dL (ref 0.0–1.2)
CO2: 23 mmol/L (ref 20–29)
Calcium: 10.6 mg/dL — ABNORMAL HIGH (ref 8.7–10.2)
Chloride: 103 mmol/L (ref 96–106)
Creatinine, Ser: 1.04 mg/dL (ref 0.76–1.27)
GFR calc Af Amer: 97 mL/min/{1.73_m2} (ref >59)
GFR calc non Af Amer: 84 mL/min/{1.73_m2} (ref >59)
Globulin, Total: 2.3 g/dL (ref 1.5–4.5)
Glucose: 112 mg/dL — ABNORMAL HIGH (ref 65–99)
Potassium: 3.9 mmol/L (ref 3.5–5.2)
Sodium: 141 mmol/L (ref 134–144)
Total Protein: 6.9 g/dL (ref 6.0–8.5)

## 2019-11-15 ENCOUNTER — Telehealth: Payer: Self-pay

## 2019-11-15 NOTE — Telephone Encounter (Signed)
-----   Message from Trinna Post, [place] sent at 11/15/2019  3:39 PM EST ----- Labs stable. Will see him in follow up.

## 2019-11-15 NOTE — Telephone Encounter (Signed)
Tired calling patient no answer and could not leave a voicemail. Will try patient at a later time.

## 2020-03-12 NOTE — Progress Notes (Deleted)
    Established patient visit    Patient: Shane Hayes   DOB: 04/21/1971   49 y.o. Male  MRN: [id number] Visit Date: 03/12/2020  Today's healthcare provider: Trey Sailors, PA-C   No chief complaint on file.  Subjective    HPI Hypertension, follow-up  BP Readings from Last 3 Encounters:  11/11/19 130/88  09/16/19 (!) 129/92  02/18/19 136/89   Wt Readings from Last 3 Encounters:  11/11/19 238 lb (108 kg)  09/16/19 232 lb (105.2 kg)  02/18/19 237 lb 9.6 oz (107.8 kg)     He was last seen for hypertension 4 months ago.  BP at that visit was 130/88. Management since that visit includes continue current medication .Rechecked kidney function. Possible AKI or HTN kidney disease. He reports {excellent/good/fair/poor:19665} compliance with treatment. He {is/is not:9024} having side effects. {document side effects if present:1} He is following a {diet:21022986} diet. He {is/is not:9024} exercising. He {does/does not:200015} smoke.  Use of agents associated with hypertension: {bp agents assoc with hypertension:511::"none"}.   Outside blood pressures are {***enter patient reported home BP readings, or 'not being checked':1}. Symptoms: {Yes/No:20286} chest pain {Yes/No:20286} chest pressure/discomfort {Yes/No:20286} dyspnea (difficulty breathing) {Yes/No:20286} lower extremity edema {Yes/No:20286} orthopnea  {Yes/No:20286} palpitations {Yes/No:20286} paroxysmal nocturnal dyspnea  {Yes/No:20286} syncope  Pertinent labs: Lab Results  Component Value Date   CHOL 131 09/16/2019   HDL 39 (L) 09/16/2019   LDLCALC 78 09/16/2019   TRIG 68 09/16/2019   CHOLHDL 3.4 09/16/2019   Lab Results  Component Value Date   NA 141 11/11/2019   K 3.9 11/11/2019   CL 103 11/11/2019   CO2 23 11/11/2019   GLUCOSE 112 (H) 11/11/2019   BUN 23 11/11/2019   CREATININE 1.04 11/11/2019   CALCIUM 10.6 (H) 11/11/2019   GFRNONAA 84 11/11/2019   GFRAA 97 11/11/2019     The 10-year ASCVD  risk score Denman George DC Jr., et al., 2013) is: 8.2%   ---------------------------------------------------------------------------------------------------  {Show patient history (optional):23778::" "}   Medications: Outpatient Medications Prior to Visit  Medication Sig  . fluticasone (FLONASE) 50 MCG/ACT nasal spray Place 2 sprays into both nostrils daily.  . hydrochlorothiazide (HYDRODIURIL) 25 MG tablet Take 1 tablet (25 mg total) by mouth daily.  . metoprolol succinate (TOPROL-XL) 100 MG 24 hr tablet Take 1 tablet (100 mg total) by mouth daily. Take with or immediately following a meal.  . Multiple Vitamin (MULTIVITAMIN) tablet Take 1 tablet by mouth daily.   No facility-administered medications prior to visit.    Review of Systems  Constitutional: Negative.   Respiratory: Negative.   Cardiovascular: Negative.   Hematological: Negative.     {Show previous labs (optional):23779::" "}   Objective    There were no vitals taken for this visit. {Show previous vital signs (optional):23777::" "}  Physical Exam  ***  No results found for any visits on 03/16/20.   Assessment & Plan    ***  No follow-ups on file.      {provider attestation***:1}   Maryella Shivers  [facility] [phone] (phone) [phone] (fax)  [facility]

## 2020-03-16 ENCOUNTER — Ambulatory Visit: Payer: 59 | Admitting: Physician Assistant

## 2021-02-05 ENCOUNTER — Encounter: Payer: 59 | Admitting: Physician Assistant

## 2021-02-06 ENCOUNTER — Other Ambulatory Visit: Payer: Self-pay

## 2021-02-06 ENCOUNTER — Ambulatory Visit (INDEPENDENT_AMBULATORY_CARE_PROVIDER_SITE_OTHER): Payer: BC Managed Care – PPO | Admitting: Physician Assistant

## 2021-02-06 VITALS — BP 137/97 | HR 82 | Temp 98.3°F | Ht 65.0 in | Wt 222.9 lb

## 2021-02-06 DIAGNOSIS — Z1211 Encounter for screening for malignant neoplasm of colon: Secondary | ICD-10-CM | POA: Diagnosis not present

## 2021-02-06 DIAGNOSIS — I10 Essential (primary) hypertension: Secondary | ICD-10-CM

## 2021-02-06 DIAGNOSIS — Z0000 Encounter for general adult medical examination without abnormal findings: Secondary | ICD-10-CM | POA: Diagnosis not present

## 2021-02-06 DIAGNOSIS — E669 Obesity, unspecified: Secondary | ICD-10-CM | POA: Diagnosis not present

## 2021-02-06 DIAGNOSIS — Z1159 Encounter for screening for other viral diseases: Secondary | ICD-10-CM | POA: Diagnosis not present

## 2021-02-06 DIAGNOSIS — Z23 Encounter for immunization: Secondary | ICD-10-CM

## 2021-02-06 MED ORDER — AMLODIPINE BESYLATE 5 MG PO TABS: 5.0000 mg | ORAL_TABLET | Freq: Every day | ORAL | 0 refills | Status: DC

## 2021-02-06 NOTE — Patient Instructions (Addendum)
Discussed eligibility for Shing[employer] and to check with insurance company.   Health Maintenance, Male Adopting a healthy lifestyle and getting preventive care are important in promoting health and wellness. Ask your health care provider about:  The right schedule for you to have regular tests and exams.  Things you can do on your own to prevent diseases and keep yourself healthy. What should I know about diet, weight, and exercise? Eat a healthy diet  Eat a diet that includes plenty of vegetables, fruits, low-fat dairy products, and lean protein.  Do not eat a lot of foods that are high in solid fats, added sugars, or sodium.   Maintain a healthy weight Body mass index (BMI) is a measurement that can be used to identify possible weight problems. It estimates body fat based on height and weight. Your health care provider can help determine your BMI and help you achieve or maintain a healthy weight. Get regular exercise Get regular exercise. This is one of the most important things you can do for your health. Most adults should:  Exercise for at least 150 minutes each week. The exercise should increase your heart rate and make you sweat (moderate-intensity exercise).  Do strengthening exercises at least twice a week. This is in addition to the moderate-intensity exercise.  Spend less time sitting. Even light physical activity can be beneficial. Watch cholesterol and blood lipids Have your blood tested for lipids and cholesterol at 51 years of age, then have this test every 5 years. You may need to have your cholesterol levels checked more often if:  Your lipid or cholesterol levels are high.  You are older than 51 years of age.  You are at high risk for heart disease. What should I know about cancer screening? Many types of cancers can be detected early and may often be prevented. Depending on your health history and family history, you may need to have cancer screening at various ages.  This may include screening for:  Colorectal cancer.  Prostate cancer.  Skin cancer.  Lung cancer. What should I know about heart disease, diabetes, and high blood pressure? Blood pressure and heart disease  High blood pressure causes heart disease and increases the risk of stroke. This is more likely to develop in people who have high blood pressure readings, are of African descent, or are overweight.  Talk with your health care provider about your target blood pressure readings.  Have your blood pressure checked: ? Every 3-5 years if you are 45-39 years of age. ? Every year if you are 40 years old or older.  If you are between the ages of 33 and 64 and are a current or former smoker, ask your health care provider if you should have a one-time screening for abdominal aortic aneurysm (AAA). Diabetes Have regular diabetes screenings. This checks your fasting blood sugar level. Have the screening done:  Once every three years after age 74 if you are at a normal weight and have a low risk for diabetes.  More often and at a younger age if you are overweight or have a high risk for diabetes. What should I know about preventing infection? Hepatitis B If you have a higher risk for hepatitis B, you should be screened for this virus. Talk with your health care provider to find out if you are at risk for hepatitis B infection. Hepatitis C Blood testing is recommended for:  Everyone born from [birth year] through 1966.  Anyone with known risk factors for  hepatitis C. Sexually transmitted infections (STIs)  You should be screened each year for STIs, including gonorrhea and chlamydia, if: ? You are sexually active and are younger than 51 years of age. ? You are older than 51 years of age and your health care provider tells you that you are at risk for this type of infection. ? Your sexual activity has changed since you were last screened, and you are at increased risk for chlamydia or gonorrhea.  Ask your health care provider if you are at risk.  Ask your health care provider about whether you are at high risk for HIV. Your health care provider may recommend a prescription medicine to help prevent HIV infection. If you choose to take medicine to prevent HIV, you should first get tested for HIV. You should then be tested every 3 months for as long as you are taking the medicine. Follow these instructions at home: Lifestyle  Do not use any products that contain nicotine or tobacco, such as cigarettes, e-cigarettes, and chewing tobacco. If you need help quitting, ask your health care provider.  Do not use street drugs.  Do not share needles.  Ask your health care provider for help if you need support or information about quitting drugs. Alcohol use  Do not drink alcohol if your health care provider tells you not to drink.  If you drink alcohol: ? Limit how much you have to 0-2 drinks a day. ? Be aware of how much alcohol is in your drink. In the [place] one drink equals one 12 oz bottle of beer (355 mL), one 5 oz glass of wine (148 mL), or one 1 oz glass of hard liquor (44 mL). General instructions  Schedule regular health, dental, and eye exams.  Stay current with your vaccines.  Tell your health care provider if: ? You often feel depressed. ? You have ever been abused or do not feel safe at home. Summary  Adopting a healthy lifestyle and getting preventive care are important in promoting health and wellness.  Follow your health care provider's instructions about healthy diet, exercising, and getting tested or screened for diseases.  Follow your health care provider's instructions on monitoring your cholesterol and blood pressure. This information is not intended to replace advice given to you by your health care provider. Make sure you discuss any questions you have with your health care provider. Document Revised: 11/03/2018 Document Reviewed: 11/03/2018 Elsevier Patient  Education  2021 [employer].

## 2021-02-06 NOTE — Progress Notes (Signed)
Complete physical exam   Patient: Shane Hayes   DOB: 28-Feb-1971   50 y.o. Male  MRN: [id number] Visit Date: 02/06/2021  Today's healthcare provider: Trinna Post, PA-C   Chief Complaint  Patient presents with  . Annual Exam  I,Emilyanne Mcgough M Armani Gawlik,acting as a scribe for Trinna Post, PA-C.,have documented all relevant documentation on the behalf of Trinna Post, PA-C,as directed by  Trinna Post, PA-C while in the presence of Trinna Post, PA-C.  Subjective    Shane Hayes is a 50 y.o. male who presents today for a complete physical exam.  He reports consuming a general and low sodium diet. Home exercise routine includes push up and situps. He generally feels well. He reports sleeping fairly well. He does not have additional problems to discuss today.  HPI   Has had some job transition over the past year, resumed his old job. Hasn't been seen since 10/2019. He has stopped taking his blood pressure medications for one year. He reports it causes erectile dysfunction.   Hypertension, follow-up  BP Readings from Last 3 Encounters:  02/06/21 (!) 137/97  11/11/19 130/88  09/16/19 (!) 129/92   Wt Readings from Last 3 Encounters:  02/06/21 222 lb 14.4 oz (101.1 kg)  11/11/19 238 lb (108 kg)  09/16/19 232 lb (105.2 kg)     He was last seen for hypertension 12 months ago.  BP at that visit was elevated. Management since that visit includes continue metoprolol and hctz. He reports erectile dysfunction with medication.  He reports poor compliance with treatment. He is having side effects. Erectile dysfunction He is following a Regular diet. He is not exercising. He does not smoke.  Use of agents associated with hypertension: none.   Outside blood pressures are not checked. Symptoms: No chest pain No chest pressure  No palpitations No syncope  No dyspnea No orthopnea  No paroxysmal nocturnal dyspnea No lower extremity edema   Pertinent labs: Lab  Results  Component Value Date   CHOL 141 02/06/2021   HDL 47 02/06/2021   LDLCALC 81 02/06/2021   TRIG 60 02/06/2021   CHOLHDL 3.0 02/06/2021   Lab Results  Component Value Date   NA 142 02/06/2021   K 4.3 02/06/2021   CREATININE 1.14 02/06/2021   GFRNONAA 84 11/11/2019   GFRAA 97 11/11/2019   GLUCOSE 106 (H) 02/06/2021     The 10-year ASCVD risk score Mikey Bussing DC Jr., et al., 2013) is: 9.1%   ---------------------------------------------------------------------------------------------------  Wt Readings from Last 3 Encounters:  02/06/21 222 lb 14.4 oz (101.1 kg)  11/11/19 238 lb (108 kg)  09/16/19 232 lb (105.2 kg)   Eating Habits:  Morning: cup of water, grits 2 prebuttered packs with flaxseed Lunch: [place] food [place] cu[place]: pollo frodita (chicken and rice) sour cream, drank water Dinner: 2 kombucha , king size reeses cup, potato chips from a large bag Snacks: poptarts strawberry (pack of two), sunchips, canned spinach  Drinks: coke zero once per week, no sweet tea, zero sugar packets, no juice or milk  Past Medical History:  Diagnosis Date  . Erectile dysfunction   . HTN (hypertension)    Past Surgical History:  Procedure Laterality Date  . CARPAL TUNNEL RELEASE Left 2013   Social History   Socioeconomic History  . Marital status: Divorced    Spouse name: Not on file  . Number of children: Not on file  . Years of education: Not on file  .  Highest education level: Not on file  Occupational History  . Not on file  Tobacco Use  . Smoking status: Former Smoker    Types: Cigars    Quit date: 05/2018    Years since quitting: 2.7  . Smokeless tobacco: Never Used  Vaping Use  . Vaping Use: Never used  Substance and Sexual Activity  . Alcohol use: No    Alcohol/week: 0.0 standard drinks  . Drug use: No  . Sexual activity: Yes    Birth control/protection: None  Other Topics Concern  . Not on file  Social History Narrative  . Not on file   Social  Determinants of Health   Financial Resource Strain: Not on file  Food Insecurity: Not on file  Transportation Needs: Not on file  Physical Activity: Not on file  Stress: Not on file  Social Connections: Not on file  Intimate Partner Violence: Not on file   Family Status  Relation Name Status  . Mother  Alive  . Father  Other  . Sister  Alive  . Daughter  Alive  . Son  Alive  . MGM  Deceased  . MGF  Deceased       died from complications from pneumonia  . PGM  Other  . PGF  Other  . Daughter  Alive   Family History  Problem Relation Age of Onset  . Healthy Mother   . Healthy Sister   . Healthy Daughter   . Healthy Son   . Congestive Heart Failure Maternal Grandmother   . Healthy Daughter    No Known Allergies  Patient Care Team: Paulene Floor as PCP - General (Physician Assistant)   Medications: Outpatient Medications Prior to Visit  Medication Sig  . Multiple Vitamin (MULTIVITAMIN) tablet Take 1 tablet by mouth daily.  . [DISCONTINUED] fluticasone (FLONASE) 50 MCG/ACT nasal spray Place 2 sprays into both nostrils daily. (Patient not taking: Reported on 02/06/2021)  . [DISCONTINUED] hydrochlorothiazide (HYDRODIURIL) 25 MG tablet Take 1 tablet (25 mg total) by mouth daily. (Patient not taking: Reported on 02/06/2021)  . [DISCONTINUED] metoprolol succinate (TOPROL-XL) 100 MG 24 hr tablet Take 1 tablet (100 mg total) by mouth daily. Take with or immediately following a meal. (Patient not taking: Reported on 02/06/2021)   No facility-administered medications prior to visit.    Review of Systems  Constitutional: Negative.   HENT: Negative.   Eyes: Negative.   Respiratory: Negative.   Cardiovascular: Negative.   Gastrointestinal: Negative.   Genitourinary: Negative.   Musculoskeletal: Negative.   Skin: Negative.   Allergic/Immunologic: Negative.   Neurological: Negative.   Hematological: Negative.   Psychiatric/Behavioral: Negative.        Objective     BP (!) 137/97 (BP Location: Left Arm, Patient Position: Sitting, Cuff Size: Normal)   Pulse 82   Temp 98.3 F (36.8 C) (Oral)   Ht 5' 5"  (1.651 m)   Wt 222 lb 14.4 oz (101.1 kg)   SpO2 98%   BMI 37.09 kg/m     Physical Exam Constitutional:      Appearance: Normal appearance.  HENT:     Right Ear: Tympanic membrane, ear canal and external ear normal.     Left Ear: Tympanic membrane, ear canal and external ear normal.  Cardiovascular:     Rate and Rhythm: Normal rate and regular rhythm.     Pulses: Normal pulses.     Heart sounds: Normal heart sounds.  Pulmonary:     Effort: Pulmonary  effort is normal.     Breath sounds: Normal breath sounds.  Abdominal:     General: Abdomen is flat. Bowel sounds are normal.     Palpations: Abdomen is soft.  Skin:    General: Skin is warm and dry.  Neurological:     General: No focal deficit present.     Mental Status: He is alert and oriented to person, place, and time.  Psychiatric:        Mood and Affect: Mood normal.        Behavior: Behavior normal.       Last depression screening scores PHQ 2/9 Scores 02/06/2021 11/11/2019 12/17/2018  PHQ - 2 Score 0 0 0  PHQ- 9 Score 1 - -   Last fall risk screening Fall Risk  02/06/2021  Falls in the past year? 0  Number falls in past yr: 0  Injury with Fall? 0  Risk for fall due to : No Fall Risks  Follow up Falls evaluation completed   Last Audit-C alcohol use screening Alcohol Use Disorder Test (AUDIT) 02/06/2021  1. How often do you have a drink containing alcohol? 2  2. How many drinks containing alcohol do you have on a typical day when you are drinking? 0  3. How often do you have six or more drinks on one occasion? 0  AUDIT-C Score 2   A score of 3 or more in women, and 4 or more in men indicates increased risk for alcohol abuse, EXCEPT if all of the points are from question 1   Results for orders placed or performed in visit on 02/06/21  HIV Antibody (routine testing w rflx)   Result Value Ref Range   HIV Screen 4th Generation wRfx Non Reactive Non Reactive  TSH  Result Value Ref Range   TSH 0.678 0.450 - 4.500 uIU/mL  Lipid panel  Result Value Ref Range   Cholesterol, Total 141 100 - 199 mg/dL   Triglycerides 60 0 - 149 mg/dL   HDL 47 >39 mg/dL   VLDL Cholesterol Cal 13 5 - 40 mg/dL   LDL Chol Calc (NIH) 81 0 - 99 mg/dL   Chol/HDL Ratio 3.0 0.0 - 5.0 ratio  Comprehensive metabolic panel  Result Value Ref Range   Glucose 106 (H) 65 - 99 mg/dL   BUN 12 6 - 24 mg/dL   Creatinine, Ser 1.14 0.76 - 1.27 mg/dL   eGFR 78 >59 mL/min/1.73   BUN/Creatinine Ratio 11 9 - 20   Sodium 142 134 - 144 mmol/L   Potassium 4.3 3.5 - 5.2 mmol/L   Chloride 105 96 - 106 mmol/L   CO2 23 20 - 29 mmol/L   Calcium 10.3 (H) 8.7 - 10.2 mg/dL   Total Protein 6.9 6.0 - 8.5 g/dL   Albumin 4.6 4.0 - 5.0 g/dL   Globulin, Total 2.3 1.5 - 4.5 g/dL   Albumin/Globulin Ratio 2.0 1.2 - 2.2   Bilirubin Total 0.5 0.0 - 1.2 mg/dL   Alkaline Phosphatase 73 44 - 121 IU/L   AST 26 0 - 40 IU/L   ALT 32 0 - 44 IU/L  CBC with Differential/Platelet  Result Value Ref Range   WBC 8.2 3.4 - 10.8 x10E3/uL   RBC 5.27 4.14 - 5.80 x10E6/uL   Hemoglobin 15.5 13.0 - 17.7 g/dL   Hematocrit 47.5 37.5 - 51.0 %   MCV 90 79 - 97 fL   MCH 29.4 26.6 - 33.0 pg   MCHC 32.6 31.5 -  35.7 g/dL   RDW 12.2 11.6 - 15.4 %   Platelets 293 150 - 450 x10E3/uL   Neutrophils 64 Not Estab. %   Lymphs 23 Not Estab. %   Monocytes 8 Not Estab. %   Eos 4 Not Estab. %   Basos 1 Not Estab. %   Neutrophils Absolute 5.3 1.4 - 7.0 x10E3/uL   Lymphocytes Absolute 1.9 0.7 - 3.1 x10E3/uL   Monocytes Absolute 0.6 0.1 - 0.9 x10E3/uL   EOS (ABSOLUTE) 0.3 0.0 - 0.4 x10E3/uL   Basophils Absolute 0.1 0.0 - 0.2 x10E3/uL   Immature Granulocytes 0 Not Estab. %   Immature Grans (Abs) 0.0 0.0 - 0.1 x10E3/uL  Hepatitis C antibody  Result Value Ref Range   Hep C Virus Ab <0.1 0.0 - 0.9 s/co ratio    Assessment & Plan    Routine  Health Maintenance and Physical Exam  Exercise Activities and Dietary recommendations Goals   None     Immunization History  Administered Date(s) Administered  . Influenza,inj,Quad PF,6+ Mos 08/28/2017, 11/05/2018  . Tdap 12/23/2013    Health Maintenance  Topic Date Due  . COVID-19 Vaccine (1) Never done  . COLONOSCOPY (Pts 45-23yr Insurance coverage will need to be confirmed)  Never done  . INFLUENZA VACCINE  03/25/2021 (Originally 06/24/2020)  . TETANUS/TDAP  12/24/2023  . Hepatitis C Screening  Completed  . HIV Screening  Completed  . HPV VACCINES  Aged Out    Discussed health benefits of physical activity, and encouraged him to engage in regular exercise appropriate for his age and condition.  1. Annual physical exam  - HIV Antibody (routine testing w rflx) - TSH - Lipid panel - Comprehensive metabolic panel - CBC with Differential/Platelet  2. Essential hypertension  Switch to amlodipine to avoid potential side effect of erectile dysfunction. May consider viagra if symptoms persist. Follow up in one month.   - amLODipine (NORVASC) 5 MG tablet; Take 1 tablet (5 mg total) by mouth daily.  Dispense: 90 tablet; Refill: 0  3. Encounter for hepatitis C screening test for low risk patient  - Hepatitis C Antibody  4. Colon cancer screening  - Ambulatory referral to Gastroenterology  5. Need for shingles vaccine  Patient says he will think about this.   6. Obesity with serious comorbidity, unspecified classification, unspecified obesity type  Discussed importance of healthy weight management Discussed diet and exercise Offered nutrition referral, patient would like to make that decision in a month.     Return if symptoms worsen or fail to improve.     I,Trinna Post PA-C, have reviewed all documentation for this visit. The documentation on 02/07/21 for the exam, diagnosis, procedures, and orders are all accurate and complete.  The entirety of the  information documented in the History of Present Illness, Review of Systems and Physical Exam were personally obtained by me. Portions of this information were initially [facility] reviewed by me for thoroughness and accuracy.     APaulene Floor BSpring [phone](phone) [phone](fax)  [place]

## 2021-02-07 ENCOUNTER — Telehealth: Payer: Self-pay

## 2021-02-07 LAB — CBC WITH DIFFERENTIAL/PLATELET
Basophils Absolute: 0.1 10*3/uL (ref 0.0–0.2)
Basos: 1 %
EOS (ABSOLUTE): 0.3 10*3/uL (ref 0.0–0.4)
Eos: 4 %
Hematocrit: 47.5 % (ref 37.5–51.0)
Hemoglobin: 15.5 g/dL (ref 13.0–17.7)
Immature Grans (Abs): 0 10*3/uL (ref 0.0–0.1)
Immature Granulocytes: 0 %
Lymphocytes Absolute: 1.9 10*3/uL (ref 0.7–3.1)
Lymphs: 23 %
MCH: 29.4 pg (ref 26.6–33.0)
MCHC: 32.6 g/dL (ref 31.5–35.7)
MCV: 90 fL (ref 79–97)
Monocytes Absolute: 0.6 10*3/uL (ref 0.1–0.9)
Monocytes: 8 %
Neutrophils Absolute: 5.3 10*3/uL (ref 1.4–7.0)
Neutrophils: 64 %
Platelets: 293 10*3/uL (ref 150–450)
RBC: 5.27 x10E6/uL (ref 4.14–5.80)
RDW: 12.2 % (ref 11.6–15.4)
WBC: 8.2 10*3/uL (ref 3.4–10.8)

## 2021-02-07 LAB — COMPREHENSIVE METABOLIC PANEL
ALT: 32 IU/L (ref 0–44)
AST: 26 IU/L (ref 0–40)
Albumin/Globulin Ratio: 2 (ref 1.2–2.2)
Albumin: 4.6 g/dL (ref 4.0–5.0)
Alkaline Phosphatase: 73 IU/L (ref 44–121)
BUN/Creatinine Ratio: 11 (ref 9–20)
BUN: 12 mg/dL (ref 6–24)
Bilirubin Total: 0.5 mg/dL (ref 0.0–1.2)
CO2: 23 mmol/L (ref 20–29)
Calcium: 10.3 mg/dL — ABNORMAL HIGH (ref 8.7–10.2)
Chloride: 105 mmol/L (ref 96–106)
Creatinine, Ser: 1.14 mg/dL (ref 0.76–1.27)
Globulin, Total: 2.3 g/dL (ref 1.5–4.5)
Glucose: 106 mg/dL — ABNORMAL HIGH (ref 65–99)
Potassium: 4.3 mmol/L (ref 3.5–5.2)
Sodium: 142 mmol/L (ref 134–144)
Total Protein: 6.9 g/dL (ref 6.0–8.5)
eGFR: 78 mL/min/{1.73_m2} (ref >59)

## 2021-02-07 LAB — HIV ANTIBODY (ROUTINE TESTING W REFLEX): HIV Screen 4th Generation wRfx: NONREACTIVE

## 2021-02-07 LAB — LIPID PANEL
Chol/HDL Ratio: 3 ratio (ref 0.0–5.0)
Cholesterol, Total: 141 mg/dL (ref 100–199)
HDL: 47 mg/dL (ref >39)
LDL Chol Calc (NIH): 81 mg/dL (ref 0–99)
Triglycerides: 60 mg/dL (ref 0–149)
VLDL Cholesterol Cal: 13 mg/dL (ref 5–40)

## 2021-02-07 LAB — HEPATITIS C ANTIBODY: Hep C Virus Ab: <0.1 s/co ratio (ref 0.0–0.9)

## 2021-02-07 LAB — TSH: TSH: 0.678 u[IU]/mL (ref 0.450–4.500)

## 2021-02-07 NOTE — Telephone Encounter (Signed)
Patient was advised and verbalized understanding. 

## 2021-02-07 NOTE — Telephone Encounter (Signed)
-----   Message from Trey Sailors, [place] sent at 02/07/2021  8:06 AM EDT ----- Labs stable, though cardiovascular risk elevated. Suggest discussing management of this at follow up with PCP.

## 2021-02-26 ENCOUNTER — Emergency Department: Payer: BC Managed Care – PPO

## 2021-02-26 ENCOUNTER — Emergency Department
Admission: EM | Admit: 2021-02-26 | Discharge: 2021-02-27 | Disposition: A | Discharge status: Home / Self Care | Payer: BC Managed Care – PPO | Attending: Emergency Medicine | Admitting: Emergency Medicine

## 2021-02-26 DIAGNOSIS — I10 Essential (primary) hypertension: Secondary | ICD-10-CM | POA: Insufficient documentation

## 2021-02-26 DIAGNOSIS — Z79899 Other long term (current) drug therapy: Secondary | ICD-10-CM | POA: Diagnosis not present

## 2021-02-26 DIAGNOSIS — Z87891 Personal history of nicotine dependence: Secondary | ICD-10-CM | POA: Insufficient documentation

## 2021-02-26 DIAGNOSIS — B349 Viral infection, unspecified: Secondary | ICD-10-CM | POA: Diagnosis not present

## 2021-02-26 DIAGNOSIS — R0602 Shortness of breath: Secondary | ICD-10-CM | POA: Diagnosis not present

## 2021-02-26 DIAGNOSIS — Z20822 Contact with and (suspected) exposure to covid-19: Secondary | ICD-10-CM | POA: Diagnosis not present

## 2021-02-26 DIAGNOSIS — R531 Weakness: Secondary | ICD-10-CM | POA: Diagnosis not present

## 2021-02-26 LAB — CBC
HCT: 49.9 % (ref 39.0–52.0)
Hemoglobin: 16.5 g/dL (ref 13.0–17.0)
MCH: 30.1 pg (ref 26.0–34.0)
MCHC: 33.1 g/dL (ref 30.0–36.0)
MCV: 90.9 fL (ref 80.0–100.0)
Platelets: 279 10*3/uL (ref 150–400)
RBC: 5.49 MIL/uL (ref 4.22–5.81)
RDW: 12.2 % (ref 11.5–15.5)
WBC: 20.6 10*3/uL — ABNORMAL HIGH (ref 4.0–10.5)
nRBC: 0 % (ref 0.0–0.2)

## 2021-02-26 LAB — CBG MONITORING, ED: Glucose-Capillary: 89 mg/dL (ref 70–99)

## 2021-02-26 LAB — BASIC METABOLIC PANEL
Anion gap: 12 (ref 5–15)
BUN: 26 mg/dL — ABNORMAL HIGH (ref 6–20)
CO2: 27 mmol/L (ref 22–32)
Calcium: 10.1 mg/dL (ref 8.9–10.3)
Chloride: 102 mmol/L (ref 98–111)
Creatinine, Ser: 1.45 mg/dL — ABNORMAL HIGH (ref 0.61–1.24)
GFR, Estimated: 59 mL/min — ABNORMAL LOW (ref >60)
Glucose, Bld: 107 mg/dL — ABNORMAL HIGH (ref 70–99)
Potassium: 3.7 mmol/L (ref 3.5–5.1)
Sodium: 141 mmol/L (ref 135–145)

## 2021-02-26 MED ORDER — LACTATED RINGERS IV BOLUS
1000.0000 mL | Freq: Once | INTRAVENOUS | Status: AC
  Administered 2021-02-26: 1000 mL via INTRAVENOUS

## 2021-02-26 MED ORDER — ONDANSETRON HCL 4 MG/2ML IJ SOLN
4.0000 mg | Freq: Once | INTRAMUSCULAR | Status: AC
  Administered 2021-02-26: 4 mg via INTRAVENOUS
  Filled 2021-02-26: qty 2

## 2021-02-26 NOTE — ED Triage Notes (Signed)
Pt presents with a cc of generalized weakness since 2000 tonight. Pt reports shortness of breath and chills earlier tonight. No unilateral weakness noted.  Denies PMH. Alert and oriented x4. Denies pain.

## 2021-02-26 NOTE — ED Notes (Signed)
Pt s/p emesis episode with Dr Don Perking at bedside.  Reddish emesis noted on the floor when this nurse entered room; PCXR in progress.  Pt has since received 4mg  IVP Zofran and now has 1L LR infusing to 20G L AC; dressing dry and intact.  No additional emesis episodes noted since this nurse was at bedside.

## 2021-02-27 ENCOUNTER — Telehealth: Payer: BC Managed Care – PPO

## 2021-02-27 ENCOUNTER — Other Ambulatory Visit: Payer: Self-pay

## 2021-02-27 DIAGNOSIS — R531 Weakness: Secondary | ICD-10-CM | POA: Diagnosis not present

## 2021-02-27 DIAGNOSIS — Z20822 Contact with and (suspected) exposure to covid-19: Secondary | ICD-10-CM | POA: Diagnosis not present

## 2021-02-27 DIAGNOSIS — B349 Viral infection, unspecified: Secondary | ICD-10-CM | POA: Diagnosis not present

## 2021-02-27 DIAGNOSIS — R0602 Shortness of breath: Secondary | ICD-10-CM | POA: Diagnosis not present

## 2021-02-27 LAB — URINALYSIS, COMPLETE (UACMP) WITH MICROSCOPIC
Bacteria, UA: NONE SEEN
Bilirubin Urine: NEGATIVE
Glucose, UA: NEGATIVE mg/dL
Hgb urine dipstick: NEGATIVE
Ketones, ur: 5 mg/dL — AB
Leukocytes,Ua: NEGATIVE
Nitrite: NEGATIVE
Protein, ur: 30 mg/dL — AB
Specific Gravity, Urine: 1.025 (ref 1.005–1.030)
Squamous Epithelial / HPF: NONE SEEN (ref 0–5)
pH: 5 (ref 5.0–8.0)

## 2021-02-27 LAB — RESP PANEL BY RT-PCR (FLU A&B, COVID) ARPGX2
Influenza A by PCR: NEGATIVE
Influenza B by PCR: NEGATIVE
SARS Coronavirus 2 by RT PCR: NEGATIVE

## 2021-02-27 LAB — LACTIC ACID, PLASMA
Lactic Acid, Venous: 1.5 mmol/L (ref 0.5–1.9)
Lactic Acid, Venous: 2.3 mmol/L (ref 0.5–1.9)

## 2021-02-27 LAB — TROPONIN I (HIGH SENSITIVITY)
Troponin I (High Sensitivity): 6 ng/L (ref <18)
Troponin I (High Sensitivity): 5 ng/L (ref <18)

## 2021-02-27 LAB — PROCALCITONIN: Procalcitonin: <0.1 ng/mL

## 2021-02-27 MED ORDER — ACETAMINOPHEN 500 MG PO TABS
1000.0000 mg | ORAL_TABLET | Freq: Once | ORAL | Status: AC
  Administered 2021-02-27: 1000 mg via ORAL
  Filled 2021-02-27: qty 2

## 2021-02-27 MED ORDER — ONDANSETRON 4 MG PO TBDP: 4.0000 mg | ORAL_TABLET | Freq: Three times a day (TID) | ORAL | 0 refills | Status: DC | PRN

## 2021-02-27 MED ORDER — LACTATED RINGERS IV BOLUS
1000.0000 mL | Freq: Once | INTRAVENOUS | Status: AC
  Administered 2021-02-27: 1000 mL via INTRAVENOUS

## 2021-02-27 NOTE — ED Provider Notes (Signed)
[facility] Emergency Department Provider Note  ____________________________________________  Time seen: Approximately 12:25 AM  I have reviewed the triage vital signs and the nursing notes.   HISTORY  Chief Complaint Weakness   HPI Shane Hayes is a 50 y.o. male with a history of hypertension who presents for evaluation of generalized weakness.  Patient reports being in his usual state of health when he went to work today.  He is a Production designer, theatre/television/film at [employer].  Around 8 PM he started feeling chills, generalized weakness, shortness of breath.  On arrival to the ED started feeling nauseous and was actively vomiting during my evaluation.  He denies chest pain, cough, sore throat, fever, abdominal pain, diarrhea.  According to the wife he had Covid a few months ago.  He is vaccinated against it.   Past Medical History:  Diagnosis Date  . Erectile dysfunction   . HTN (hypertension)     Patient Active Problem List   Diagnosis Date Noted  . Essential hypertension 02/18/2019  . Frequent headaches 02/12/2016  . Allergic rhinitis 08/03/2015  . History of decompression of median nerve 08/03/2015  . H/O erectile dysfunction 08/03/2015    Past Surgical History:  Procedure Laterality Date  . CARPAL TUNNEL RELEASE Left 2013    Prior to Admission medications   Medication Sig Start Date End Date Taking? Authorizing Provider  ondansetron (ZOFRAN ODT) 4 MG disintegrating tablet Take 1 tablet (4 mg total) by mouth every 8 (eight) hours as needed. 02/27/21  Yes Shatonia Hoots, [place], MD  amLODipine (NORVASC) 5 MG tablet Take 1 tablet (5 mg total) by mouth daily. 02/06/21   Trey Sailors, PA-C  Multiple Vitamin (MULTIVITAMIN) tablet Take 1 tablet by mouth daily.    [provider]    Allergies Patient has no known allergies.  Family History  Problem Relation Age of Onset  . Healthy Mother   . Healthy Sister   . Healthy Daughter   . Healthy Son   .  Congestive Heart Failure Maternal Grandmother   . Healthy Daughter     Social History Social History   Tobacco Use  . Smoking status: Former Smoker    Types: Cigars    Quit date: 05/2018    Years since quitting: 2.7  . Smokeless tobacco: Never Used  Vaping Use  . Vaping Use: Never used  Substance Use Topics  . Alcohol use: No    Alcohol/week: 0.0 standard drinks  . Drug use: No    Review of Systems  Constitutional: Negative for fever. + chills Eyes: Negative for visual changes. ENT: Negative for sore throat. Neck: No neck pain  Cardiovascular: Negative for chest pain. Respiratory: + shortness of breath. Gastrointestinal: Negative for abdominal pain,  Diarrhea. + nausea and vomiting Genitourinary: Negative for dysuria. Musculoskeletal: Negative for back pain. Skin: Negative for rash. Neurological: Negative for headaches, weakness or numbness. Psych: No SI or HI  ____________________________________________   PHYSICAL EXAM:  VITAL SIGNS: ED Triage Vitals  Enc Vitals Group     BP 02/26/21 2222 (!) 132/94     Pulse Rate 02/26/21 2222 (!) 107     Resp 02/26/21 2222 20     Temp 02/26/21 2222 97.8 F (36.6 C)     Temp src --      SpO2 02/26/21 2222 97 %     Weight 02/26/21 2220 220 lb (99.8 kg)     Height 02/26/21 2220 5\' 5"  (1.651 m)     Head Circumference --  Peak Flow --      Pain Score 02/26/21 2220 0     Pain Loc --      Pain Edu? --      Excl. in GC? --     Constitutional: Alert and oriented, actively vomiting.  HEENT:      Head: Normocephalic and atraumatic.         Eyes: Conjunctivae are normal. Sclera is non-icteric.       Mouth/Throat: Mucous membranes are moist.       Neck: Supple with no signs of meningismus. Cardiovascular: Tachycardic with regular rhythm and no murmurs Respiratory: Normal respiratory effort. Lungs are clear to auscultation bilaterally.  Gastrointestinal: Soft, non tender, and non distended with positive bowel sounds. No  rebound or guarding. Genitourinary: No CVA tenderness. Musculoskeletal:  No edema, cyanosis, or erythema of extremities. Neurologic: Normal speech and language. Face is symmetric.  Intact strength and sensation x4 with no facial droop Skin: Skin is warm, dry and intact. No rash noted. Psychiatric: Mood and affect are normal. Speech and behavior are normal.  ____________________________________________   LABS (all labs ordered are listed, but only abnormal results are displayed)  Labs Reviewed  BASIC METABOLIC PANEL - Abnormal; Notable for the following components:      Result Value   Glucose, Bld 107 (*)    BUN 26 (*)    Creatinine, Ser 1.45 (*)    GFR, Estimated 59 (*)    All other components within normal limits  CBC - Abnormal; Notable for the following components:   WBC 20.6 (*)    All other components within normal limits  URINALYSIS, COMPLETE (UACMP) WITH MICROSCOPIC - Abnormal; Notable for the following components:   Color, Urine YELLOW (*)    APPearance HAZY (*)    Ketones, ur 5 (*)    Protein, ur 30 (*)    All other components within normal limits  LACTIC ACID, PLASMA - Abnormal; Notable for the following components:   Lactic Acid, Venous 2.3 (*)    All other components within normal limits  RESP PANEL BY RT-PCR (FLU A&B, COVID) ARPGX2  LACTIC ACID, PLASMA  PROCALCITONIN  CBG MONITORING, ED  TROPONIN I (HIGH SENSITIVITY)  TROPONIN I (HIGH SENSITIVITY)   ____________________________________________  EKG  ED ECG REPORT I, Nita Sickle, the attending physician, personally viewed and interpreted this ECG.  Normal sinus rhythm, rate of 98, normal intervals, normal axis, T wave inversions in inferior leads with no ST elevation.  Unchanged when compared to prior from 2005 ____________________________________________  RADIOLOGY  I have personally reviewed the images performed during this visit and I agree with the Radiologist's read.   Interpretation by  Radiologist:  DG Chest Portable 1 View  Result Date: 02/26/2021 CLINICAL DATA:  Shortness of breath EXAM: PORTABLE CHEST 1 VIEW COMPARISON:  None. FINDINGS: Heart and mediastinal contours are within normal limits. No focal opacities or effusions. No acute bony abnormality. IMPRESSION: No active disease. Electronically Signed   By: Charlett Nose M.D.   On: 02/26/2021 23:37     ____________________________________________   PROCEDURES  Procedure(s) performed:yes .1-3 Lead EKG Interpretation Performed by: Nita Sickle, MD Authorized by: Nita Sickle, MD     Interpretation: non-specific     ECG rate assessment: normal     Rhythm: sinus rhythm     Ectopy: none     Conduction: normal     Critical Care performed:  None ____________________________________________   INITIAL IMPRESSION / ASSESSMENT AND PLAN / ED COURSE  50 y.o. male with a history of hypertension who presents for evaluation of generalized weakness, chills, SOB, nausea, and vomiting that started while at work this evening at [employer].  Patient actively vomiting but afebrile, slightly tachycardic with normal work of breathing, normal sats, lungs are clear to auscultation, normotensive.  Abdomen is soft and nontender.  EKG with no signs of dysrhythmias or ischemia.  DDX viral gastroenteritis, covid, flu, viral syndrome, PNA, pericarditis, myocarditis.  Labs showing white count of 20.6 and a lactic of 2.3.  Slightly increase in his creatinine but with no significant electrolyte derangements.  Chest x-ray visualized by me with no signs of pneumonia, confirmed by radiology.  Procalcitonin, Covid and flu pending.  UA is pending.  Will treat with IV fluids, Zofran and reassess.  _________________________ 3:02 AM on 02/27/2021 -----------------------------------------  Procalcitonin negative, lactic is down to normal after receiving fluids.  Heart rate has improved significantly with IV fluids.  No further episodes of  vomiting or diarrhea.  Covid and flu negative.  UA showing mild ketonuria consistent with dehydration but no signs of infection.  Most likely a viral gastroenteritis syndrome.  Patient has no abdominal pain or tenderness to palpation therefore less likely to be appendicitis or any other intra-abdominal pathology that requires a CT.  Patient denies any chest pain or shortness of breath.  Discussed increase oral hydration, Zofran, bland diet, follow-up with PCP.  Recommended return to the emergency room for fever, chest pain, shortness of breath, abdominal pain, or any signs of dehydration.  These recommendations were discussed with patient and his girlfriend was at bedside       _____________________________________________ Please note:  Patient was evaluated in Emergency Department today for the symptoms described in the history of present illness. Patient was evaluated in the context of the global COVID-19 pandemic, which necessitated consideration that the patient might be at risk for infection with the SARS-CoV-2 virus that causes COVID-19. Institutional protocols and algorithms that pertain to the evaluation of patients at risk for COVID-19 are in a state of rapid change based on information released by regulatory bodies including the CDC and federal and state organizations. These policies and algorithms were followed during the patient's care in the ED.  Some ED evaluations and interventions may be delayed as a result of limited staffing during the pandemic.   [place] Controlled Substance Database was reviewed by me. ____________________________________________   FINAL CLINICAL IMPRESSION(S) / ED DIAGNOSES   Final diagnoses:  Viral illness      NEW MEDICATIONS STARTED DURING THIS VISIT:  ED Discharge Orders         Ordered    ondansetron (ZOFRAN ODT) 4 MG disintegrating tablet  Every 8 hours PRN        02/27/21 0301           Note:  This document was prepared using Dragon voice  recognition software and may include unintentional dictation errors.    Don Perking, [place], MD 02/27/21 [phone]

## 2021-02-27 NOTE — ED Notes (Signed)
Pt resting with eyes closed; easily aroused with verbal stimulation.  Remains cooperative with staff.  Repeat trop and lactic acid obtained at this time per Dr Don Perking.   HR remains elevated after 1L bolus (current HR 122).  Will continue to monitor for acute changes and maintain plan of care

## 2021-02-27 NOTE — ED Notes (Signed)
2.3 critical lactic acid called in at this time by lab - will notify provider

## 2021-02-27 NOTE — ED Notes (Signed)
Dr Don Perking notified at this time of 2.3 critical lacid acid

## 2021-02-27 NOTE — Discharge Instructions (Signed)
Take plenty of fluids at home, eat a bland diet for the next 2 days.  Take Zofran as needed for nausea and vomiting.  Return to the emergency room for chest pain, shortness of breath, or abdominal pain.  Otherwise follow-up with your doctor in 2 days.

## 2021-02-27 NOTE — ED Notes (Signed)
Pt agreeable with d/c plan as discussed by provider - this nurse has verbally reinforced d/c instructions and provided written copy - pt acknowledges verbal understanding and denies any additional questions, concerns, needs.  Ambulatory independently at discharge with steady gait-escorted home by significant other

## 2021-02-27 NOTE — ED Notes (Signed)
Oral temp 99.64F per Dr Don Perking - 1gm Tylenol and 2nd liter bolus ordered and subsequently administered.  4 extra blankets removed from pt also at this time-- pt has one blanket now.  Denies any additional questions, concerns, needs.  Will monitor for acute changes and maintain plan of care.

## 2021-02-28 ENCOUNTER — Ambulatory Visit: Payer: Self-pay

## 2021-02-28 ENCOUNTER — Telehealth: Payer: Self-pay | Admitting: Physician Assistant

## 2021-03-06 ENCOUNTER — Encounter: Payer: Self-pay | Admitting: Family Medicine

## 2021-03-06 ENCOUNTER — Ambulatory Visit (INDEPENDENT_AMBULATORY_CARE_PROVIDER_SITE_OTHER): Payer: BC Managed Care – PPO | Admitting: Family Medicine

## 2021-03-06 ENCOUNTER — Other Ambulatory Visit: Payer: Self-pay

## 2021-03-06 ENCOUNTER — Telehealth (INDEPENDENT_AMBULATORY_CARE_PROVIDER_SITE_OTHER): Payer: BC Managed Care – PPO | Admitting: Gastroenterology

## 2021-03-06 VITALS — BP 126/87 | HR 89 | Resp 16 | Wt 219.0 lb

## 2021-03-06 DIAGNOSIS — Z1211 Encounter for screening for malignant neoplasm of colon: Secondary | ICD-10-CM

## 2021-03-06 DIAGNOSIS — Z09 Encounter for follow-up examination after completed treatment for conditions other than malignant neoplasm: Secondary | ICD-10-CM | POA: Diagnosis not present

## 2021-03-06 MED ORDER — PEG 3350-KCL-NA BICARB-NACL 420 G PO SOLR: 4000.0000 mL | Freq: Once | ORAL | 0 refills | Status: AC

## 2021-03-06 NOTE — Progress Notes (Signed)
Gastroenterology Pre-Procedure Review  Request Date: 04/27 Requesting Physician: Dr. Allegra Lai  PATIENT REVIEW QUESTIONS: The patient responded to the following health history questions as indicated:    1. Are you having any GI issues? no 2. Do you have a personal history of Polyps? no 3. Do you have a family history of Colon Cancer or Polyps? no 4. Diabetes Mellitus? no 5. Joint replacements in the past 12 months?yes (ER Visit Viral Illness 02/26/21) 6. Major health problems in the past 3 months?no 7. Any artificial heart valves, MVP, or defibrillator?no    MEDICATIONS & ALLERGIES:    Patient reports the following regarding taking any anticoagulation/antiplatelet therapy:   Plavix, Coumadin, Eliquis, Xarelto, Lovenox, Pradaxa, Brilinta, or Effient? no Aspirin? no  Patient confirms/reports the following medications:  Current Outpatient Medications  Medication Sig Dispense Refill  . amLODipine (NORVASC) 5 MG tablet Take 1 tablet (5 mg total) by mouth daily. 90 tablet 0  . Multiple Vitamin (MULTIVITAMIN) tablet Take 1 tablet by mouth daily.    . ondansetron (ZOFRAN ODT) 4 MG disintegrating tablet Take 1 tablet (4 mg total) by mouth every 8 (eight) hours as needed. 20 tablet 0   No current facility-administered medications for this visit.    Patient confirms/reports the following allergies:  No Known Allergies  No orders of the defined types were placed in this encounter.   AUTHORIZATION INFORMATION Primary Insurance: 1D#: Group #:  Secondary Insurance: 1D#: Group #:  SCHEDULE INFORMATION: Date:  Time: Location:

## 2021-03-06 NOTE — Progress Notes (Signed)
Established patient visit   Patient: Shane Hayes   DOB: [birth year]   50 y.o. Male  MRN: [id number] Visit Date: 03/06/2021  Today's healthcare provider: Azalee Course Dayle Mcnerney, FNP   Chief Complaint  Patient presents with  . ER follow up    Subjective    HPI  Follow up ER visit  Patient was seen in ER for weakness on 02/27/2021. He was treated for viral gastroenteritis. Treatment for this included Zofran and bland diet. He reports fair compliance with treatment. He reports this condition is Improved.  Is a Production designer, theatre/television/film at [employer] Started feeling dizzy and weak, diaphoresis, kept dozing off Started feeling sob His gf picked him up from work and brought him to the ED Didn't vomit til he was at the hospital Never picked up medications from the hospital Within a few days was feeling 100% better  Has never had a colonoscopy before Colonoscopy scheduled for 03/20/21  Lab Results  Component Value Date   NA 141 02/26/2021   K 3.7 02/26/2021   CO2 27 02/26/2021   GLUCOSE 107 (H) 02/26/2021   BUN 26 (H) 02/26/2021   CREATININE 1.45 (H) 02/26/2021   CALCIUM 10.1 02/26/2021   GFRNONAA 59 (L) 02/26/2021   GFRAA 97 11/11/2019   Lab Results  Component Value Date   WBC 20.6 (H) 02/26/2021   HGB 16.5 02/26/2021   HCT 49.9 02/26/2021   MCV 90.9 02/26/2021   PLT 279 02/26/2021    Medications: Outpatient Medications Prior to Visit  Medication Sig  . Multiple Vitamin (MULTIVITAMIN) tablet Take 1 tablet by mouth daily.  Marland Kitchen amLODipine (NORVASC) 5 MG tablet Take 1 tablet (5 mg total) by mouth daily. (Patient not taking: No sig reported)  . ondansetron (ZOFRAN ODT) 4 MG disintegrating tablet Take 1 tablet (4 mg total) by mouth every 8 (eight) hours as needed. (Patient not taking: No sig reported)  . polyethylene glycol-electrolytes (NULYTELY) 420 g solution Take 4,000 mLs by mouth once for 1 dose. Fill container to the fill line with clear liquid.  Mix well. Drink 8 oz every 30 minutes  until entire contents have been completed.  Do not eat or drink anything 2 hours before procedure. (Patient not taking: Reported on 03/06/2021)   No facility-administered medications prior to visit.    Review of Systems  Constitutional: Negative.   Cardiovascular: Negative for chest pain, palpitations and leg swelling.  Musculoskeletal: Negative for arthralgias, myalgias, neck pain and neck stiffness.  Neurological: Negative for dizziness and headaches.      Objective    BP 126/87   Pulse 89   Resp 16   Wt 219 lb (99.3 kg)   BMI 36.44 kg/m   Physical Exam Vitals reviewed.  Constitutional:      Appearance: Normal appearance.  HENT:     Head: Normocephalic and atraumatic.  Eyes:     Conjunctiva/sclera: Conjunctivae normal.     Pupils: Pupils are equal, round, and reactive to light.  Cardiovascular:     Rate and Rhythm: Normal rate and regular rhythm.     Pulses: Normal pulses.     Heart sounds: Normal heart sounds. No murmur heard. No friction rub. No gallop.   Pulmonary:     Effort: Pulmonary effort is normal. No respiratory distress.     Breath sounds: Normal breath sounds. No stridor. No wheezing or rales.  Abdominal:     General: Bowel sounds are normal. There is no distension.  Palpations: Abdomen is soft. There is no mass.     Tenderness: There is no abdominal tenderness. There is no guarding or rebound.  Musculoskeletal:     Right lower leg: No edema.     Left lower leg: No edema.  Skin:    General: Skin is warm and dry.  Neurological:     General: No focal deficit present.     Mental Status: He is alert and oriented to person, place, and time.  Psychiatric:        Mood and Affect: Mood normal.        Behavior: Behavior normal.      No results found for any visits on 03/06/21.  Assessment & Plan     Problem List Items Addressed This Visit   None   Visit Diagnoses    Hospital discharge follow-up    -  Primary   Relevant Orders   CBC with  Differential   Comprehensive metabolic panel     Plan  Will follow up with lab results  Continue to follow up with GI and colonoscopy  RTC/Ed precautions provided  Return in about 6 months (around 09/05/2021).       Azalee Course Senovia Gauer, FNP  [facility] [phone] (phone) [phone] (fax)  [facility]

## 2021-03-06 NOTE — Patient Instructions (Signed)

## 2021-03-07 LAB — CBC WITH DIFFERENTIAL/PLATELET
Basophils Absolute: 0.1 10*3/uL (ref 0.0–0.2)
Basos: 1 %
EOS (ABSOLUTE): 0.3 10*3/uL (ref 0.0–0.4)
Eos: 3 %
Hematocrit: 45.6 % (ref 37.5–51.0)
Hemoglobin: 15 g/dL (ref 13.0–17.7)
Immature Grans (Abs): 0 10*3/uL (ref 0.0–0.1)
Immature Granulocytes: 0 %
Lymphocytes Absolute: 2.2 10*3/uL (ref 0.7–3.1)
Lymphs: 22 %
MCH: 29.4 pg (ref 26.6–33.0)
MCHC: 32.9 g/dL (ref 31.5–35.7)
MCV: 89 fL (ref 79–97)
Monocytes Absolute: 0.7 10*3/uL (ref 0.1–0.9)
Monocytes: 7 %
Neutrophils Absolute: 6.7 10*3/uL (ref 1.4–7.0)
Neutrophils: 67 %
Platelets: 349 10*3/uL (ref 150–450)
RBC: 5.1 x10E6/uL (ref 4.14–5.80)
RDW: 12.2 % (ref 11.6–15.4)
WBC: 9.9 10*3/uL (ref 3.4–10.8)

## 2021-03-07 NOTE — Progress Notes (Signed)
Labs look great. White count has resolved completely.

## 2021-03-19 ENCOUNTER — Encounter: Payer: Self-pay | Admitting: Gastroenterology

## 2021-03-20 ENCOUNTER — Other Ambulatory Visit: Payer: Self-pay

## 2021-03-20 ENCOUNTER — Ambulatory Visit: Payer: BC Managed Care – PPO | Admitting: Anesthesiology

## 2021-03-20 ENCOUNTER — Encounter: Payer: Self-pay | Admitting: Gastroenterology

## 2021-03-20 ENCOUNTER — Ambulatory Visit
Admission: RE | Admit: 2021-03-20 | Discharge: 2021-03-20 | Disposition: A | Discharge status: Home / Self Care | Payer: BC Managed Care – PPO | Attending: Gastroenterology | Admitting: Gastroenterology

## 2021-03-20 ENCOUNTER — Encounter
Admission: RE | Disposition: A | Discharge status: Home / Self Care | Payer: Self-pay | Source: Home / Self Care | Attending: Gastroenterology

## 2021-03-20 DIAGNOSIS — K644 Residual hemorrhoidal skin tags: Secondary | ICD-10-CM | POA: Insufficient documentation

## 2021-03-20 DIAGNOSIS — Z87891 Personal history of nicotine dependence: Secondary | ICD-10-CM | POA: Diagnosis not present

## 2021-03-20 DIAGNOSIS — Z79899 Other long term (current) drug therapy: Secondary | ICD-10-CM | POA: Diagnosis not present

## 2021-03-20 DIAGNOSIS — Z1211 Encounter for screening for malignant neoplasm of colon: Secondary | ICD-10-CM | POA: Insufficient documentation

## 2021-03-20 HISTORY — PX: COLONOSCOPY WITH PROPOFOL: SHX5780

## 2021-03-20 SURGERY — COLONOSCOPY WITH PROPOFOL
Anesthesia: General

## 2021-03-20 MED ORDER — PROPOFOL 500 MG/50ML IV EMUL
INTRAVENOUS | Status: DC | PRN
  Administered 2021-03-20: 125 ug/kg/min via INTRAVENOUS

## 2021-03-20 MED ORDER — SODIUM CHLORIDE 0.9 % IV SOLN: INTRAVENOUS | Status: DC

## 2021-03-20 MED ORDER — PROPOFOL 500 MG/50ML IV EMUL
INTRAVENOUS | Status: DC | PRN
  Administered 2021-03-20: 70 mg via INTRAVENOUS

## 2021-03-20 MED ORDER — LIDOCAINE HCL (CARDIAC) PF 100 MG/5ML IV SOSY
PREFILLED_SYRINGE | INTRAVENOUS | Status: DC | PRN
  Administered 2021-03-20: 60 mg via INTRAVENOUS

## 2021-03-20 NOTE — Anesthesia Postprocedure Evaluation (Signed)
Anesthesia Post Note  Patient: Shane Hayes  Procedure(s) Performed: COLONOSCOPY WITH PROPOFOL (N/A )  Patient location during evaluation: Endoscopy Anesthesia Type: General Level of consciousness: awake and alert Pain management: pain level controlled Vital Signs Assessment: post-procedure vital signs reviewed and stable Respiratory status: spontaneous breathing, nonlabored ventilation, respiratory function stable and patient connected to nasal cannula oxygen Cardiovascular status: blood pressure returned to baseline and stable Postop Assessment: no apparent nausea or vomiting Anesthetic complications: no   No complications documented.   Last Vitals:  Vitals:   03/20/21 0850 03/20/21 0900  BP: 117/78 118/85  Pulse: 75 73  Resp: 13 15  Temp:    SpO2: 97% 99%    Last Pain:  Vitals:   03/20/21 0717  TempSrc: Temporal  PainSc: 0-No pain                 Cleda Mccreedy Jan Walters

## 2021-03-20 NOTE — Transfer of Care (Signed)
Immediate Anesthesia Transfer of Care Note  Patient: Shane Hayes  Procedure(s) Performed: COLONOSCOPY WITH PROPOFOL (N/A )  Patient Location: PACU and Endoscopy Unit  Anesthesia Type:General  Level of Consciousness: drowsy  Airway & Oxygen Therapy: Patient Spontanous Breathing  Post-op Assessment: Report given to RN and Post -op Vital signs reviewed and stable  Post vital signs: stable  Last Vitals:  Vitals Value Taken Time  BP 102/66 03/20/21 0828  Temp    Pulse 84 03/20/21 0828  Resp 12 03/20/21 0828  SpO2 93 % 03/20/21 0828  Vitals shown include unvalidated device data.  Last Pain:  Vitals:   03/20/21 0717  TempSrc: Temporal  PainSc: 0-No pain         Complications: No complications documented.

## 2021-03-20 NOTE — H&P (Signed)
Arlyss Repress, MD [address]  [address]  [place], [place] [address]  Main: [phone]  Fax: [phone] Pager: [phone]  Primary Care Physician:  Maryella Shivers Primary Gastroenterologist:  Dr. Arlyss Repress  Pre-Procedure History & Physical: HPI:  Shane Hayes is a 50 y.o. male is here for an colonoscopy.   Past Medical History:  Diagnosis Date  . Erectile dysfunction   . HTN (hypertension)     Past Surgical History:  Procedure Laterality Date  . CARPAL TUNNEL RELEASE Left 2013    Prior to Admission medications   Medication Sig Start Date End Date Taking? Authorizing Provider  Multiple Vitamin (MULTIVITAMIN) tablet Take 1 tablet by mouth daily.   Yes [provider]  amLODipine (NORVASC) 5 MG tablet Take 1 tablet (5 mg total) by mouth daily. Patient not taking: No sig reported 02/06/21   Trey Sailors, PA-C  ondansetron (ZOFRAN ODT) 4 MG disintegrating tablet Take 1 tablet (4 mg total) by mouth every 8 (eight) hours as needed. Patient not taking: No sig reported 02/27/21   Nita Sickle, MD    Allergies as of 03/06/2021  . (No Known Allergies)    Family History  Problem Relation Age of Onset  . Healthy Mother   . Healthy Sister   . Healthy Daughter   . Healthy Son   . Congestive Heart Failure Maternal Grandmother   . Healthy Daughter     Social History   Socioeconomic History  . Marital status: Divorced    Spouse name: Not on file  . Number of children: Not on file  . Years of education: Not on file  . Highest education level: Not on file  Occupational History  . Not on file  Tobacco Use  . Smoking status: Former Smoker    Types: Cigars    Quit date: 05/2018    Years since quitting: 2.8  . Smokeless tobacco: Never Used  Vaping Use  . Vaping Use: Never used  Substance and Sexual Activity  . Alcohol use: Yes    Alcohol/week: 0.0 standard drinks    Comment: none last 24hrs  . Drug use: No  . Sexual  activity: Yes    Birth control/protection: None  Other Topics Concern  . Not on file  Social History Narrative  . Not on file   Social Determinants of Health   Financial Resource Strain: Not on file  Food Insecurity: Not on file  Transportation Needs: Not on file  Physical Activity: Not on file  Stress: Not on file  Social Connections: Not on file  Intimate Partner Violence: Not on file    Review of Systems: See HPI, otherwise negative ROS  Physical Exam: BP (!) 142/96   Pulse 71   Temp (!) 97.1 F (36.2 C) (Temporal)   Resp 16   Ht 5\' 5"  (1.651 m)   Wt 99.3 kg   SpO2 99%   BMI 36.44 kg/m  General:   Alert,  pleasant and cooperative in NAD Head:  Normocephalic and atraumatic. Neck:  Supple; no masses or thyromegaly. Lungs:  Clear throughout to auscultation.    Heart:  Regular rate and rhythm. Abdomen:  Soft, nontender and nondistended. Normal bowel sounds, without guarding, and without rebound.   Neurologic:  Alert and  oriented x4;  grossly normal neurologically.  Impression/Plan: Shane Hayes is here for an colonoscopy to be performed for colon cancer screening  Risks, benefits, limitations, and alternatives regarding  colonoscopy have been  reviewed with the patient.  Questions have been answered.  All parties agreeable.   Lannette Donath, MD  03/20/2021, 8:07 AM

## 2021-03-20 NOTE — Op Note (Signed)
[facility] Gastroenterology Patient Name: Shane Hayes Procedure Date: 03/20/2021 8:12 AM MRN: [id number] Account #: [id number] Date of Birth: August 26, 1971 Admit Type: Outpatient Age: 50 Room: [facility] ENDO ROOM 4 Gender: Male Note Status: Finalized Procedure:             Colonoscopy Indications:           Screening for colorectal malignant neoplasm, This is                         the patient's first colonoscopy Providers:             Toney Reil MD, MD Referring MD:          Lavella Hammock. Jodi Marble (Referring MD) Medicines:             General Anesthesia Complications:         No immediate complications. Estimated blood loss: None. Procedure:             Pre-Anesthesia Assessment:                        - Prior to the procedure, a History and Physical was                         performed, and patient medications and allergies were                         reviewed. The patient is competent. The risks and                         benefits of the procedure and the sedation options and                         risks were discussed with the patient. All questions                         were answered and informed consent was obtained.                         Patient identification and proposed procedure were                         verified by the physician, the nurse, the                         anesthesiologist, the anesthetist and the technician                         in the pre-procedure area in the procedure room in the                         endoscopy suite. Mental Status Examination: alert and                         oriented. Airway Examination: normal oropharyngeal                         airway and neck mobility. Respiratory Examination:  clear to auscultation. CV Examination: normal.                         Prophylactic Antibiotics: The patient does not require                         prophylactic antibiotics. Prior Anticoagulants: The                          patient has taken no previous anticoagulant or                         antiplatelet agents. ASA Grade Assessment: II - A                         patient with mild systemic disease. After reviewing                         the risks and benefits, the patient was deemed in                         satisfactory condition to undergo the procedure. The                         anesthesia plan was to use general anesthesia.                         Immediately prior to administration of medications,                         the patient was re-assessed for adequacy to receive                         sedatives. The heart rate, respiratory rate, oxygen                         saturations, blood pressure, adequacy of pulmonary                         ventilation, and response to care were monitored                         throughout the procedure. The physical status of the                         patient was re-assessed after the procedure.                        After obtaining informed consent, the colonoscope was                         passed under direct vision. Throughout the procedure,                         the patient's blood pressure, pulse, and oxygen                         saturations were monitored continuously. The  Colonoscope was introduced through the anus and                         advanced to the the cecum, identified by appendiceal                         orifice and ileocecal valve. The colonoscopy was                         performed with moderate difficulty due to poor bowel                         prep. The patient tolerated the procedure well. The                         quality of the bowel preparation was poor. Findings:      Skin tags were found on perianal exam.      Copious quantities of semi-liquid semi-solid stool was found in the       entire colon, precluding visualization.      The retroflexed view of the distal  rectum and anal verge was normal and       showed no anal or rectal abnormalities. Impression:            - Preparation of the colon was poor.                        - Perianal skin tags found on perianal exam.                        - Stool in the entire examined colon.                        - The distal rectum and anal verge are normal on                         retroflexion view.                        - No specimens collected. Recommendation:        - Discharge patient to home (with escort).                        - Resume previous diet today.                        - Continue present medications.                        - Repeat colonoscopy tomorrow or in 6 months with 2                         day prep because the bowel preparation was poor. Procedure Code(s):     --- Professional ---                        O5366, Colorectal cancer screening; colonoscopy on                         individual not meeting  criteria for high risk Diagnosis Code(s):     --- Professional ---                        Z12.11, Encounter for screening for malignant neoplasm                         of colon                        K64.4, Residual hemorrhoidal skin tags CPT copyright 2019 American Medical Association. All rights reserved. The codes documented in this report are preliminary and upon coder review may  be revised to meet current compliance requirements. Dr. Libby Maw Toney Reil MD, MD 03/20/2021 8:27:52 AM This report has been signed electronically. Number of Addenda: 0 Note Initiated On: 03/20/2021 8:12 AM Scope Withdrawal Time: 0 hours 3 minutes 1 second  Total Procedure Duration: 0 hours 7 minutes 35 seconds  Estimated Blood Loss:  Estimated blood loss: none.      [facility]

## 2021-03-20 NOTE — Anesthesia Preprocedure Evaluation (Signed)
Anesthesia Evaluation  Patient identified by MRN, date of birth, ID band Patient awake    Reviewed: Allergy & Precautions, H&P , NPO status , Patient's Chart, lab work & pertinent test results  History of Anesthesia Complications Negative for: history of anesthetic complications  Airway Mallampati: III  TM Distance: >3 FB Neck ROM: full    Dental  (+) Chipped   Pulmonary neg shortness of breath, former smoker,    Pulmonary exam normal        Cardiovascular Exercise Tolerance: Good hypertension, (-) angina(-) Past MI and (-) DOE Normal cardiovascular exam     Neuro/Psych  Headaches, negative psych ROS   GI/Hepatic negative GI ROS, Neg liver ROS, neg GERD  ,  Endo/Other  negative endocrine ROS  Renal/GU negative Renal ROS  negative genitourinary   Musculoskeletal   Abdominal   Peds  Hematology negative hematology ROS (+)   Anesthesia Other Findings Past Medical History: No date: Erectile dysfunction No date: HTN (hypertension)  Past Surgical History: 2013: CARPAL TUNNEL RELEASE; Left  BMI    Body Mass Index: 36.44 kg/m      Reproductive/Obstetrics negative OB ROS                             Anesthesia Physical Anesthesia Plan  ASA: III  Anesthesia Plan: General   Post-op Pain Management:    Induction: Intravenous  PONV Risk Score and Plan: Propofol infusion and TIVA  Airway Management Planned: Natural Airway and Nasal Cannula  Additional Equipment:   Intra-op Plan:   Post-operative Plan:   Informed Consent: I have reviewed the patients History and Physical, chart, labs and discussed the procedure including the risks, benefits and alternatives for the proposed anesthesia with the patient or authorized representative who has indicated his/her understanding and acceptance.     Dental Advisory Given  Plan Discussed with: Anesthesiologist, CRNA and  Surgeon  Anesthesia Plan Comments: (Patient consented for risks of anesthesia including but not limited to:  - adverse reactions to medications - risk of airway placement if required - damage to eyes, teeth, lips or other oral mucosa - nerve damage due to positioning  - sore throat or hoarseness - Damage to heart, brain, nerves, lungs, other parts of body or loss of life  Patient voiced understanding.)        Anesthesia Quick Evaluation

## 2021-03-21 ENCOUNTER — Encounter: Payer: Self-pay | Admitting: Gastroenterology

## 2021-04-03 ENCOUNTER — Ambulatory Visit (INDEPENDENT_AMBULATORY_CARE_PROVIDER_SITE_OTHER): Payer: BC Managed Care – PPO | Admitting: Family Medicine

## 2021-04-03 ENCOUNTER — Encounter: Payer: Self-pay | Admitting: Family Medicine

## 2021-04-03 ENCOUNTER — Other Ambulatory Visit: Payer: Self-pay

## 2021-04-03 VITALS — BP 117/80 | HR 79 | Resp 16 | Ht 65.0 in | Wt 223.0 lb

## 2021-04-03 DIAGNOSIS — I10 Essential (primary) hypertension: Secondary | ICD-10-CM | POA: Diagnosis not present

## 2021-04-03 DIAGNOSIS — Z6837 Body mass index (BMI) 37.0-37.9, adult: Secondary | ICD-10-CM

## 2021-04-03 DIAGNOSIS — Z87438 Personal history of other diseases of male genital organs: Secondary | ICD-10-CM

## 2021-04-03 DIAGNOSIS — E6609 Other obesity due to excess calories: Secondary | ICD-10-CM

## 2021-04-03 NOTE — Progress Notes (Signed)
Established patient visit   Patient: Shane Hayes   DOB: 08-15-1971   50 y.o. Male  MRN: [id number] Visit Date: 04/03/2021  Today's healthcare provider: Megan Mans, MD   Chief Complaint  Patient presents with  . Hypertension   Subjective    HPI  Comes in today for follow-up.  He feels well.  He has no complaints.  He is on no medication. Hypertension, follow-up  BP Readings from Last 3 Encounters:  04/03/21 117/80  03/20/21 118/85  03/06/21 126/87   Wt Readings from Last 3 Encounters:  04/03/21 223 lb (101.2 kg)  03/20/21 219 lb (99.3 kg)  03/06/21 219 lb (99.3 kg)     He was last seen for hypertension 2 months ago.  BP at that visit was 137/97. Management since that visit includes; seen by Osvaldo Angst. Switched to amlodipine to avoid potential side effect of erectile dysfunction. May consider viagra if symptoms persist. Follow up in one month.  He reports fair compliance with treatment. He is not having side effects.  He is not exercising. He is adherent to low salt diet.   Outside blood pressures are not being checked.  He does not smoke.  Use of agents associated with hypertension: none.       Medications: Outpatient Medications Prior to Visit  Medication Sig  . Multiple Vitamin (MULTIVITAMIN) tablet Take 1 tablet by mouth daily.   No facility-administered medications prior to visit.    Review of Systems  Constitutional: Negative for appetite change, chills and fever.  Respiratory: Negative for chest tightness, shortness of breath and wheezing.   Cardiovascular: Negative for chest pain and palpitations.  Gastrointestinal: Negative for abdominal pain, nausea and vomiting.        Objective    BP 117/80   Pulse 79   Resp 16   Ht 5\' 5"  (1.651 m)   Wt 223 lb (101.2 kg)   BMI 37.11 kg/m  BP Readings from Last 3 Encounters:  04/03/21 117/80  03/20/21 118/85  03/06/21 126/87   Wt Readings from Last 3 Encounters:  04/03/21 223  lb (101.2 kg)  03/20/21 219 lb (99.3 kg)  03/06/21 219 lb (99.3 kg)       Physical Exam Vitals reviewed.  Constitutional:      Appearance: Normal appearance. He is normal weight.  HENT:     Head: Normocephalic and atraumatic.  Cardiovascular:     Rate and Rhythm: Normal rate and regular rhythm.     Pulses: Normal pulses.     Heart sounds: Normal heart sounds.  Pulmonary:     Effort: Pulmonary effort is normal.     Breath sounds: Normal breath sounds.  Musculoskeletal:     Cervical back: Normal range of motion and neck supple.     Right lower leg: No edema.     Left lower leg: No edema.  Skin:    General: Skin is warm and dry.  Neurological:     General: No focal deficit present.     Mental Status: He is alert and oriented to person, place, and time. Mental status is at baseline.  Psychiatric:        Mood and Affect: Mood normal.        Behavior: Behavior normal.        Thought Content: Thought content normal.        Judgment: Judgment normal.       No results found for any visits on 04/03/21.  Assessment & Plan     1. Essential hypertension Resolved on no medication.  Follow-up in 1 year  2. H/O erectile dysfunction Patient without complaint today. 3.  Adiposity  No follow-ups on file.     I, Megan Mans, MD, have reviewed all documentation for this visit. The documentation on 04/06/21 for the exam, diagnosis, procedures, and orders are all accurate and complete.    Gurley Climer Wendelyn Breslow, MD  [facility] [phone] (phone) [phone] (fax)  [facility]

## 2021-12-05 ENCOUNTER — Emergency Department: Payer: BC Managed Care – PPO

## 2021-12-05 ENCOUNTER — Emergency Department
Admission: EM | Admit: 2021-12-05 | Discharge: 2021-12-05 | Disposition: A | Discharge status: Home / Self Care | Payer: BC Managed Care – PPO | Attending: Emergency Medicine | Admitting: Emergency Medicine

## 2021-12-05 ENCOUNTER — Other Ambulatory Visit: Payer: Self-pay

## 2021-12-05 ENCOUNTER — Encounter: Payer: Self-pay | Admitting: Emergency Medicine

## 2021-12-05 DIAGNOSIS — R9431 Abnormal electrocardiogram [ECG] [EKG]: Secondary | ICD-10-CM | POA: Insufficient documentation

## 2021-12-05 DIAGNOSIS — F17200 Nicotine dependence, unspecified, uncomplicated: Secondary | ICD-10-CM | POA: Diagnosis not present

## 2021-12-05 DIAGNOSIS — Z720 Tobacco use: Secondary | ICD-10-CM

## 2021-12-05 DIAGNOSIS — I10 Essential (primary) hypertension: Secondary | ICD-10-CM | POA: Insufficient documentation

## 2021-12-05 DIAGNOSIS — R55 Syncope and collapse: Secondary | ICD-10-CM | POA: Diagnosis not present

## 2021-12-05 LAB — CBC
HCT: 45.4 % (ref 39.0–52.0)
Hemoglobin: 14.9 g/dL (ref 13.0–17.0)
MCH: 29.9 pg (ref 26.0–34.0)
MCHC: 32.8 g/dL (ref 30.0–36.0)
MCV: 91.2 fL (ref 80.0–100.0)
Platelets: 293 10*3/uL (ref 150–400)
RBC: 4.98 MIL/uL (ref 4.22–5.81)
RDW: 12.6 % (ref 11.5–15.5)
WBC: 7.8 10*3/uL (ref 4.0–10.5)
nRBC: 0 % (ref 0.0–0.2)

## 2021-12-05 LAB — BASIC METABOLIC PANEL
Anion gap: 6 (ref 5–15)
BUN: 13 mg/dL (ref 6–20)
CO2: 29 mmol/L (ref 22–32)
Calcium: 10 mg/dL (ref 8.9–10.3)
Chloride: 105 mmol/L (ref 98–111)
Creatinine, Ser: 1.13 mg/dL (ref 0.61–1.24)
GFR, Estimated: >60 mL/min (ref >60)
Glucose, Bld: 111 mg/dL — ABNORMAL HIGH (ref 70–99)
Potassium: 4 mmol/L (ref 3.5–5.1)
Sodium: 140 mmol/L (ref 135–145)

## 2021-12-05 LAB — TROPONIN I (HIGH SENSITIVITY)
Troponin I (High Sensitivity): 5 ng/L (ref <18)
Troponin I (High Sensitivity): <2 ng/L (ref <18)

## 2021-12-05 NOTE — ED Notes (Signed)
[employer] Emergency planning/management officer) -[phone]

## 2021-12-05 NOTE — ED Notes (Signed)
Pt states he had a syncopal episode at work. Pt denies any symptoms prior to the episode & denies symptoms currently. Pt states he was laughing with coworkers & next thing he knew, he was getting up off the floor. Coworkers told pt he got right up, episode was only a few seconds.

## 2021-12-05 NOTE — ED Triage Notes (Signed)
Pt comes into the ED via POV c/o syncopal episode today while at work.  Pt states he was laughing when he passed out.  Pt did not hit his head when he had the syncopal episode and he denies any pain.  Pt in NAD at this time with even and unlabored respirations and is neurologically intact.  Pt denies any SHOB, dizziness, or chest pain.

## 2021-12-05 NOTE — ED Notes (Signed)
This tech contacted pt's manage regardless WC. According to the pt, he went to [facility] and sent to the ED to do further examination. Pt is requesting for WC because [facility] believed he needed it one because he had a syncopal episode.  [employer] Emergency planning/management officer) is aware and agreed this should not be filed under [facility] because it was "not work related injury," but they will open a claim if other wise.

## 2021-12-05 NOTE — ED Provider Notes (Signed)
[facility] Provider Note    Event Date/Time   First MD Initiated Contact with Patient 12/05/21 1157     (approximate)   History   Loss of Consciousness   HPI  Shane Hayes is a 51 y.o. male with a past medical history of high blood pressure not currently on antihypertensives as it had previously been well controlled with lifestyle modification and tobacco abuse who presents for assessment after a syncopal episode that occurred at work earlier today.  Patient states he was standing and laughing very hard and as last thing he remembers.  He states he thinks he fell because he was standing but does not think he injured himself as he has no headache, neck pain, extremity pain chest abdomen or back pain.  This was also witnessed and he was not told that he had anything or injured himself.  He states otherwise he has been in his usual state health without any other recent similar episodes, fevers, chills, cough, nausea, vomiting, diarrhea, rash, focal extremity weakness numbness or tingling, vision changes, vertigo or any other acute complaints.  He denies regular EtOH use or illicit drug use.  Denies any known history of CAD or family history of sudden cardiac death in a similar age or younger relative.  No history of seizures.  No incontinence or numbness.     Physical Exam  Triage Vital Signs: ED Triage Vitals  Enc Vitals Group     BP 12/05/21 1103 (!) 151/107     Pulse Rate 12/05/21 1103 85     Resp 12/05/21 1103 17     Temp 12/05/21 1103 98.7 F (37.1 C)     Temp Source 12/05/21 1103 Oral     SpO2 12/05/21 1103 98 %     Weight 12/05/21 1101 223 lb 1.7 oz (101.2 kg)     Height 12/05/21 1101 5\' 5"  (1.651 m)     Head Circumference --      Peak Flow --      Pain Score 12/05/21 1100 0     Pain Loc --      Pain Edu? --      Excl. in GC? --     Most recent vital signs: Vitals:   12/05/21 1103  BP: (!) 151/107  Pulse: 85  Resp: 17  Temp: 98.7 F  (37.1 C)  SpO2: 98%    General: Awake, no distress.  CV:  Good peripheral perfusion.  No murmurs rubs or gallops.  2+ radial pulses. Resp:  Normal effort.  Clear bilaterally. Abd:  No distention.  Soft throughout. Other:  Cranial nerves II through XII grossly intact.  No pronator drift.  No finger dysmetria.  Symmetric 5/5 strength of all extremities.  Sensation intact to light touch in all extremities.  Unremarkable unassisted gait.  No tenderness step-offs or deformities over the C/T/L-spine.  Extremities are unremarkable.   ED Results / Procedures / Treatments  Labs (all labs ordered are listed, but only abnormal results are displayed) Labs Reviewed  BASIC METABOLIC PANEL - Abnormal; Notable for the following components:      Result Value   Glucose, Bld 111 (*)    All other components within normal limits  CBC  URINALYSIS, ROUTINE W REFLEX MICROSCOPIC  TROPONIN I (HIGH SENSITIVITY)  TROPONIN I (HIGH SENSITIVITY)     EKG  ECG remarkable for sinus rhythm with a ventricular rate of 71, otherwise unremarkable intervals with some subtle ST elevations in lead I, aVL  and V2 with some ST depressions and T wave inversion noted prominently in leads III and aVF.  There are some subtle depression in V6.  On comparison of the ECG from 02/28/2021 these ECGs changes appear largely similar although the ST depressions are little more pronounced in inferior leads today.   RADIOLOGY  Chest x-ray ordered and reviewed by myself shows no focal consolidation, effusion, edema, pneumothorax, cardiomegaly or other clear acute thoracic process.  I also reviewed radiologist interpretation and agree with her findings.  PROCEDURES:     MEDICATIONS ORDERED IN ED: Medications - No data to display   IMPRESSION / MDM / ASSESSMENT AND PLAN / ED COURSE  I reviewed the triage vital signs and the nursing notes.                              Differential diagnosis includes, but is not limited to  vasovagal syncope, orthostasis, dehydration, metabolic derangements, anemia, arrhythmia, ACS, and possible structural heart disease.  No history exam features suggest preceding trauma or clear factors to suggest seizure at this time.  Low suspicion for toxic ingestion or withdrawal at this time.  No focal neurological deficits to suggest a CVA.  Patient denies any chest pain or shortness of breath and is stable vitals and I have low suspicion for a PE or dissection.  Chest x-ray ordered and reviewed by myself shows no focal consolidation, effusion, edema, pneumothorax, cardiomegaly or other clear acute thoracic process.  I also reviewed radiologist interpretation and agree with her findings.  CBC shows no leukocytosis or acute anemia.  BMP without any significant metabolic or electrolyte derangements.   ECG remarkable for sinus rhythm with a ventricular rate of 71, otherwise unremarkable intervals with some subtle ST elevations in lead I, aVL and V2 with some ST depressions and T wave inversion noted prominently in leads III and aVF.  There are some subtle depression in V6.  On comparison of the ECG from 02/28/2021 these ECGs changes appear largely similar although the ST depressions are little more pronounced in inferior leads today.  Troponins x2 are nonelevated and will have a lower suspicion for cardiac ischemia changes on ECG are concerning for possible hypertrophic cardiomyopathy possibly related to uncontrolled hypertension.  I discussed this with on-call cardiologist Dr. Lewie Loron who agreed that this.  He recommended obtaining an outpatient echocardiogram which he will order through his office and having patient follow-up with PCP.  I discussed this with patient he is amenable this plan.  Also advised to follow-up with PCP to have his blood pressure rechecked.  Discussed importance of tobacco cessation.  Discharged in stable condition.  Strict return precautions advised and discussed.     FINAL  CLINICAL IMPRESSION(S) / ED DIAGNOSES   Final diagnoses:  Syncope, unspecified syncope type  Hypertension, unspecified type  Tobacco abuse  Abnormal ECG     Rx / DC Orders   ED Discharge Orders     None        Note:  This document was prepared using Dragon voice recognition software and may include unintentional dictation errors.   Gilles Chiquito, MD 12/05/21 [phone]

## 2021-12-12 ENCOUNTER — Other Ambulatory Visit: Payer: Self-pay

## 2021-12-12 DIAGNOSIS — R9431 Abnormal electrocardiogram [ECG] [EKG]: Secondary | ICD-10-CM

## 2021-12-12 DIAGNOSIS — R55 Syncope and collapse: Secondary | ICD-10-CM

## 2021-12-12 NOTE — Progress Notes (Signed)
Dr. Mariah Milling sent secure message to scheduling, requesting ECHO on pt for syncope/abnormal EKG  Will need hospital f/u as well.

## 2021-12-17 ENCOUNTER — Other Ambulatory Visit: Payer: Self-pay

## 2021-12-17 ENCOUNTER — Ambulatory Visit: Payer: BC Managed Care – PPO | Admitting: Family Medicine

## 2021-12-17 VITALS — BP 139/89 | HR 85 | Temp 98.1°F | Ht 66.0 in | Wt 240.0 lb

## 2021-12-17 DIAGNOSIS — I10 Essential (primary) hypertension: Secondary | ICD-10-CM | POA: Diagnosis not present

## 2021-12-17 DIAGNOSIS — R55 Syncope and collapse: Secondary | ICD-10-CM | POA: Insufficient documentation

## 2021-12-17 NOTE — Assessment & Plan Note (Addendum)
Unclear cause. No further episodes or symptoms to concern for ACS, arrhythmia. Low suspicion for neuro, toxic etiology. Awaiting further cardiology work up at upcoming appt. BP management as above.

## 2021-12-17 NOTE — Progress Notes (Signed)
° ° °  SUBJECTIVE:   CHIEF COMPLAINT / HPI:   ED FOLLOW UP Hospital/facility: [facility] 1/12 Diagnosis: syncope Procedures/tests:  - CXR neg - EKG sinus rhythm with mild ST elevation in I, aVL, V2 and mild ST depressions in leads III, aVF, V6. - trops neg x2 - neg neuro exam Consultants: Cardiology New medications: none Discharge instructions:   - f/u with Cardiology outpt, planning for ECHO, scheduled for 2/9. Status: stable - per Cardiology discussion with ED provider, EKG findings overall stable from prior and thought may be 2/2 hypertensive cardiomyopathy. Planning for f/u ECHO. - patient reports he was standing and laughing at the time of syncope. Next thing he remembers is looking up. Denies any preceding symptoms. Never happened before.  - denies any reoccurence.  - denies CP, SOB, Nausea.  - no recent illnesses. - did recently break up with his girlfriend of 16 years. - used to be on blood pressure medication years ago but came down and was able to discontinue. - recently started back smoking, previously had quit for 3 years. Cigars, 2-3 per day. - grandmother with MI at 85yo, no FH of early cardiac disease.  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 Very active at work, no issues.   OBJECTIVE:   BP 139/89 (BP Location: Right Arm, Patient Position: Sitting, Cuff Size: Normal)    Pulse 85    Temp 98.1 F (36.7 C) (Temporal)    Ht 5\' 6"  (1.676 m)    Wt 240 lb (108.9 kg)    SpO2 97%    BMI 38.74 kg/m   Gen: well appearing, in NAD Card: RRR Lungs: CTAB Ext: WWP, no edema  ASSESSMENT/PLAN:   Essential hypertension Only slightly elevated today with prior normal readings. Will have patient monitor at home with log, f/u in 2 weeks. Obtaining labs today. Recommend tobacco cessation and weight loss. Has had 20lb weight gain over the past year, that with recent restarting smoking and stress with recent break up likely contributing.     Syncope Unclear cause. No further episodes or symptoms to concern for ACS, arrhythmia. Low suspicion for neuro, toxic etiology. Awaiting further cardiology work up at upcoming appt. BP management as above.     , DO

## 2021-12-17 NOTE — Patient Instructions (Signed)
It was great to see you!  Our plans for today:  - Check your blood pressure periodically with a blood pressure cuff. You can get this from the pharmacy or [place] back in 2 weeks.   We are checking some labs today, we will release these results to your MyChart.  Take care and seek immediate care sooner if you develop any concerns.   Dr. Ky Barban

## 2021-12-17 NOTE — Assessment & Plan Note (Addendum)
Only slightly elevated today with prior normal readings. Will have patient monitor at home with log, f/u in 2 weeks. Obtaining labs today. Recommend tobacco cessation and weight loss. Has had 20lb weight gain over the past year, that with recent restarting smoking and stress with recent break up likely contributing.

## 2021-12-18 LAB — BASIC METABOLIC PANEL
BUN/Creatinine Ratio: 11 (ref 9–20)
BUN: 13 mg/dL (ref 6–24)
CO2: 25 mmol/L (ref 20–29)
Calcium: 10.5 mg/dL — ABNORMAL HIGH (ref 8.7–10.2)
Chloride: 104 mmol/L (ref 96–106)
Creatinine, Ser: 1.14 mg/dL (ref 0.76–1.27)
Glucose: 97 mg/dL (ref 70–99)
Potassium: 4.3 mmol/L (ref 3.5–5.2)
Sodium: 141 mmol/L (ref 134–144)
eGFR: 78 mL/min/{1.73_m2} (ref >59)

## 2021-12-18 LAB — LIPID PANEL
Chol/HDL Ratio: 4 ratio (ref 0.0–5.0)
Cholesterol, Total: 166 mg/dL (ref 100–199)
HDL: 42 mg/dL (ref >39)
LDL Chol Calc (NIH): 103 mg/dL — ABNORMAL HIGH (ref 0–99)
Triglycerides: 117 mg/dL (ref 0–149)
VLDL Cholesterol Cal: 21 mg/dL (ref 5–40)

## 2021-12-18 LAB — HEMOGLOBIN A1C
Est. average glucose Bld gHb Est-mCnc: 103 mg/dL
Hgb A1c MFr Bld: 5.2 % (ref 4.8–5.6)

## 2021-12-18 LAB — TSH: TSH: 0.618 u[IU]/mL (ref 0.450–4.500)

## 2021-12-31 ENCOUNTER — Ambulatory Visit: Payer: BC Managed Care – PPO | Admitting: Family Medicine

## 2022-01-02 ENCOUNTER — Other Ambulatory Visit: Payer: Self-pay

## 2022-01-02 ENCOUNTER — Ambulatory Visit (INDEPENDENT_AMBULATORY_CARE_PROVIDER_SITE_OTHER): Payer: BC Managed Care – PPO

## 2022-01-02 DIAGNOSIS — R55 Syncope and collapse: Secondary | ICD-10-CM

## 2022-01-02 DIAGNOSIS — R9431 Abnormal electrocardiogram [ECG] [EKG]: Secondary | ICD-10-CM | POA: Diagnosis not present

## 2022-01-02 LAB — ECHOCARDIOGRAM COMPLETE
Area-P 1/2: 3.91 cm2
Calc EF: 62.5 %
S' Lateral: 2.7 cm
Single Plane A2C EF: 59.9 %
Single Plane A4C EF: 63.1 %

## 2022-01-06 ENCOUNTER — Ambulatory Visit: Payer: BC Managed Care – PPO | Admitting: Family Medicine

## 2022-01-06 ENCOUNTER — Encounter: Payer: Self-pay | Admitting: Family Medicine

## 2022-01-06 ENCOUNTER — Other Ambulatory Visit: Payer: Self-pay

## 2022-01-06 ENCOUNTER — Telehealth: Payer: Self-pay

## 2022-01-06 VITALS — BP 124/99 | HR 88 | Temp 98.1°F | Resp 16 | Ht 65.0 in | Wt 243.4 lb

## 2022-01-06 DIAGNOSIS — I10 Essential (primary) hypertension: Secondary | ICD-10-CM

## 2022-01-06 NOTE — Telephone Encounter (Signed)
Able to reach pt regarding his recent ECHO Dr. Mariah Milling had a chance to review his results and advised   "Echo  Normal EF  No valve disease  Grade II diastolic dysfunction "  Shane Hayes very thankful for the phone call of his results, all questions and concerns were address with nothing further at this time. Will see at next schedule f/u appt.

## 2022-01-06 NOTE — Assessment & Plan Note (Signed)
Normotensive today. Continue to recommend weight loss through diet and exercise and tobacco cessation. F/u as scheduled.

## 2022-01-06 NOTE — Progress Notes (Signed)
° °  SUBJECTIVE:   CHIEF COMPLAINT / HPI:   Hypertension: - seen previously for ED f/u 1/24 for syncope, thought to be related to higher BP. Previous h/o HTN requiring antihypertensives but lost weight, stopped smoking and able to come off meds. Has had weight gain and started back smoking in the last year. - saw Cardiology 2/9 with normal ECHO. - Medications: none - Compliance: n/a - Checking BP at home: only a few times. Unsure of numbers.  - Denies any SOB, CP, vision changes, LE edema, medication SEs, or symptoms of hypotension - Exercise: none currently   OBJECTIVE:   BP (!) 124/99 (BP Location: Right Arm, Patient Position: Sitting, Cuff Size: Large)    Pulse 88    Temp 98.1 F (36.7 C) (Oral)    Resp 16    Ht 5\' 5"  (1.651 m)    Wt 243 lb 6.4 oz (110.4 kg)    BMI 40.50 kg/m   Gen: well appearing, in NAD Card: Reg rate Lungs: Comfortable WOB on RA Ext: WWP, no edema   ASSESSMENT/PLAN:   Essential hypertension Normotensive today. Continue to recommend weight loss through diet and exercise and tobacco cessation. F/u as scheduled.     , DO

## 2022-02-07 NOTE — Progress Notes (Signed)
? ?I,Elena D DeSanto,acting as a scribe for Wilhemena Durie, MD.,have documented all relevant documentation on the behalf of Wilhemena Durie, MD,as directed by  Wilhemena Durie, MD while in the presence of Wilhemena Durie, MD. ? ? ? ?Complete physical exam ? ? ?Patient: Shane Hayes   DOB: 12/06/1970   51 y.o. Male  MRN: [id number] ?Visit Date: 02/10/2022 ? ?Today's healthcare provider: Wilhemena Durie, MD  ? ?No chief complaint on file. ? ?Subjective  ?  ?CRISTIEN JELLISON is a 51 y.o. male who presents today for a complete physical exam.  ?He reports consuming a general diet. The patient does not participate in regular exercise at present. He generally feels well. He reports sleeping well. He does not have additional problems to discuss today.  ?He recently broke up with his girlfriend of 11 years.  He has 3 children, 2 are grown and 13 is a 20-year-old.  He smokes 2 cigars/day.  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  He does snore when he sleeps. ?HPI  ?Results of the Epworth flowsheet 02/10/2022  ?Sitting and reading 2  ?Watching TV 2  ?Sitting, inactive in a public place (e.g. a theatre or a meeting) 1  ?As a passenger in a car for an hour without a break 1  ?Lying down to rest in the afternoon when circumstances permit 2  ?Sitting and talking to someone 1  ?Sitting quietly after a lunch without alcohol 1  ?In a car, while stopped for a few minutes in traffic 1  ?Total score 11  ?  ? ?Past Medical History:  ?Diagnosis Date  ? Erectile dysfunction   ? HTN (hypertension)   ? ?Past Surgical History:  ?Procedure Laterality Date  ? CARPAL TUNNEL RELEASE Left 2013  ? COLONOSCOPY WITH PROPOFOL N/A 03/20/2021  ? Procedure: COLONOSCOPY WITH PROPOFOL;  Surgeon: Lin Landsman, MD;  Location: [facility] ENDOSCOPY;  Service: Gastroenterology;  Laterality: N/A;  ? ?Social History  ? ?Socioeconomic History  ? Marital status: Divorced  ?  Spouse name: Not on file  ? Number of children: Not on file  ? Years of education:  Not on file  ? Highest education level: Not on file  ?Occupational History  ? Not on file  ?Tobacco Use  ? Smoking status: Former  ?  Types: Cigars  ?  Quit date: 05/2018  ?  Years since quitting: 3.7  ? Smokeless tobacco: Never  ?Vaping Use  ? Vaping Use: Never used  ?Substance and Sexual Activity  ? Alcohol use: Yes  ?  Alcohol/week: 0.0 standard drinks  ?  Comment: none last 24hrs  ? Drug use: No  ? Sexual activity: Yes  ?  Birth control/protection: None  ?Other Topics Concern  ? Not on file  ?Social History Narrative  ? Not on file  ? ?Social Determinants of Health  ? ?Financial Resource Strain: Not on file  ?Food Insecurity: Not on file  ?Transportation Needs: Not on file  ?Physical Activity: Not on file  ?Stress: Not on file  ?Social Connections: Not on file  ?Intimate Partner Violence: Not on file  ? ?Family Status  ?Relation Name Status  ? Mother  Alive  ? Father  Other  ? Sister  Alive  ? Daughter  Alive  ? Son  Alive  ? MGM  Deceased  ? MGF  Deceased  ?     died from complications from pneumonia  ? PGM  Other  ? PGF  Other  ?  Daughter  Alive  ? ?Family History  ?Problem Relation Age of Onset  ? Healthy Mother   ? Healthy Sister   ? Healthy Daughter   ? Healthy Son   ? Congestive Heart Failure Maternal Grandmother   ? Healthy Daughter   ? ?No Known Allergies  ?Patient Care Team: ?[name], FNP as PCP - General (Family Medicine)  ? ?Medications: ?Outpatient Medications Prior to Visit  ?Medication Sig  ? Multiple Vitamin (MULTIVITAMIN) tablet Take 1 tablet by mouth daily. (Patient not taking: Reported on 02/10/2022)  ? ?No facility-administered medications prior to visit.  ? ? ?Review of Systems  ?Constitutional:  Positive for appetite change and unexpected weight change.  ?HENT:  Positive for rhinorrhea and sneezing.   ?Eyes: Negative.   ?Respiratory: Negative.    ?Cardiovascular: Negative.   ?Gastrointestinal: Negative.   ?Endocrine: Negative.   ?Genitourinary:  Positive for frequency.   ?Musculoskeletal: Negative.   ?Skin: Negative.   ?Allergic/Immunologic: Negative.   ?Neurological: Negative.   ?Hematological: Negative.   ?Psychiatric/Behavioral: Negative.    ?All other systems reviewed and are negative. ? ?  ? Objective  ?  ?BP (!) 139/107 (BP Location: Right Arm, Patient Position: Sitting, Cuff Size: Large)   Pulse 92   Temp 99.6 ?F (37.6 ?C) (Oral)   Ht 5\' 6"  (1.676 m)   Wt 246 lb (111.6 kg)   SpO2 98%   BMI 39.71 kg/m?  ?BP Readings from Last 3 Encounters:  ?02/10/22 (!) 139/107  ?01/06/22 (!) 124/99  ?12/17/21 139/89  ? ?Wt Readings from Last 3 Encounters:  ?02/10/22 246 lb (111.6 kg)  ?01/06/22 243 lb 6.4 oz (110.4 kg)  ?12/17/21 240 lb (108.9 kg)  ? ?  ? ? ?Physical Exam ?Constitutional:   ?   Appearance: Normal appearance.  ?HENT:  ?   Head: Normocephalic and atraumatic.  ?   Right Ear: Tympanic membrane, ear canal and external ear normal.  ?   Left Ear: Tympanic membrane, ear canal and external ear normal.  ?   Nose: Nose normal.  ?   Mouth/Throat:  ?   Mouth: Mucous membranes are moist.  ?   Pharynx: Oropharynx is clear.  ?Eyes:  ?   Extraocular Movements: Extraocular movements intact.  ?   Conjunctiva/sclera: Conjunctivae normal.  ?   Pupils: Pupils are equal, round, and reactive to light.  ?Cardiovascular:  ?   Rate and Rhythm: Normal rate and regular rhythm.  ?   Pulses: Normal pulses.  ?   Heart sounds: Normal heart sounds.  ?Pulmonary:  ?   Effort: Pulmonary effort is normal.  ?   Breath sounds: Normal breath sounds.  ?Abdominal:  ?   General: Abdomen is flat. Bowel sounds are normal.  ?   Palpations: Abdomen is soft.  ?Genitourinary: ?   Penis: Normal.   ?   Testes: Normal.  ?Musculoskeletal:     ?   General: Normal range of motion.  ?   Cervical back: Normal range of motion and neck supple.  ?Skin: ?   General: Skin is warm and dry.  ?Neurological:  ?   General: No focal deficit present.  ?   Mental Status: He is alert and oriented to person, place, and time.  ?Psychiatric:      ?   Mood and Affect: Mood normal.     ?   Behavior: Behavior normal.     ?   Thought Content: Thought content normal.     ?  Judgment: Judgment normal.  ?  ? ? ?Last depression screening scores ?PHQ 2/9 Scores 12/17/2021 02/06/2021 11/11/2019  ?PHQ - 2 Score 0 0 0  ?PHQ- 9 Score 0 1 -  ? ?Last fall risk screening ?Fall Risk  12/17/2021  ?Falls in the past year? 0  ?Number falls in past yr: 0  ?Injury with Fall? 0  ?Risk for fall due to : No Fall Risks  ?Follow up Falls evaluation completed  ? ?Last Audit-C alcohol use screening ?Alcohol Use Disorder Test (AUDIT) 12/17/2021  ?1. How often do you have a drink containing alcohol? 3  ?2. How many drinks containing alcohol do you have on a typical day when you are drinking? 0  ?3. How often do you have six or more drinks on one occasion? 0  ?AUDIT-C Score 3  ?4. How often during the last year have you found that you were not able to stop drinking once you had started? 0  ?5. How often during the last year have you failed to do what was normally expected from you because of drinking? 0  ?6. How often during the last year have you needed a first drink in the morning to get yourself going after a heavy drinking session? 0  ?7. How often during the last year have you had a feeling of guilt of remorse after drinking? 0  ?8. How often during the last year have you been unable to remember what happened the night before because you had been drinking? 0  ?9. Have you or someone else been injured as a result of your drinking? 0  ?10. Has a relative or friend or a doctor or another health worker been concerned about your drinking or suggested you cut down? 0  ?Alcohol Use Disorder Identification Test Final Score (AUDIT) 3  ? ?A score of 3 or more in women, and 4 or more in men indicates increased risk for alcohol abuse, EXCEPT if all of the points are from question 1  ? ?No results found for any visits on 02/10/22. ? Assessment & Plan  ?  ?Routine Health Maintenance and Physical  Exam ? ?Exercise Activities and Dietary recommendations ? Goals   ?None ?  ? ? ?Immunization History  ?Administered Date(s) Administered  ? Influenza,inj,Quad PF,6+ Mos 08/28/2017, 11/05/2018  ? Tdap 12/23/2013  ? ? ?[facility]al

## 2022-02-10 ENCOUNTER — Other Ambulatory Visit: Payer: Self-pay

## 2022-02-10 ENCOUNTER — Ambulatory Visit (INDEPENDENT_AMBULATORY_CARE_PROVIDER_SITE_OTHER): Payer: BC Managed Care – PPO | Admitting: Family Medicine

## 2022-02-10 ENCOUNTER — Encounter: Payer: Self-pay | Admitting: Family Medicine

## 2022-02-10 VITALS — BP 139/107 | HR 92 | Temp 99.6°F | Ht 66.0 in | Wt 246.0 lb

## 2022-02-10 DIAGNOSIS — Z0000 Encounter for general adult medical examination without abnormal findings: Secondary | ICD-10-CM | POA: Diagnosis not present

## 2022-02-10 DIAGNOSIS — I10 Essential (primary) hypertension: Secondary | ICD-10-CM | POA: Diagnosis not present

## 2022-02-10 DIAGNOSIS — Z6837 Body mass index (BMI) 37.0-37.9, adult: Secondary | ICD-10-CM

## 2022-02-10 DIAGNOSIS — R030 Elevated blood-pressure reading, without diagnosis of hypertension: Secondary | ICD-10-CM | POA: Diagnosis not present

## 2022-02-10 DIAGNOSIS — Z1211 Encounter for screening for malignant neoplasm of colon: Secondary | ICD-10-CM | POA: Diagnosis not present

## 2022-02-10 DIAGNOSIS — R29818 Other symptoms and signs involving the nervous system: Secondary | ICD-10-CM

## 2022-02-10 DIAGNOSIS — E6609 Other obesity due to excess calories: Secondary | ICD-10-CM

## 2022-02-11 LAB — PSA: Prostate Specific Ag, Serum: 1.6 ng/mL (ref 0.0–4.0)

## 2022-06-16 ENCOUNTER — Ambulatory Visit: Payer: BC Managed Care – PPO | Admitting: Family Medicine

## 2022-06-16 ENCOUNTER — Encounter: Payer: Self-pay | Admitting: Family Medicine

## 2022-06-16 VITALS — BP 141/107 | Temp 98.0°F | Resp 16 | Ht 65.0 in | Wt 238.4 lb

## 2022-06-16 DIAGNOSIS — Z6839 Body mass index (BMI) 39.0-39.9, adult: Secondary | ICD-10-CM | POA: Diagnosis not present

## 2022-06-16 DIAGNOSIS — I10 Essential (primary) hypertension: Secondary | ICD-10-CM | POA: Diagnosis not present

## 2022-06-16 DIAGNOSIS — E662 Morbid (severe) obesity with alveolar hypoventilation: Secondary | ICD-10-CM

## 2022-06-16 MED ORDER — LOSARTAN POTASSIUM-HCTZ 100-25 MG PO TABS: 1.0000 | ORAL_TABLET | Freq: Every day | ORAL | 1 refills | Status: DC

## 2022-06-16 NOTE — Assessment & Plan Note (Signed)
Chronic, previous not treated/unstable Does not wish to start calcium channel blocker d/t concern for ankle edema Will start combo ARB/HCTZ; 4-6 month f/u encouraged Denies CP Denies SOB/ DOE Denies low blood pressure/hypotension; 'did not get along with BP cuff at home' Denies vision changes No LE Edema noted on exam Seek emergent care if you develop chest pain or chest pressure

## 2022-06-16 NOTE — Progress Notes (Signed)
Established patient visit  I,Shane Hayes,acting as a scribe for [name], FNP.,have documented all relevant documentation on the behalf of [name], FNP,as directed by  [name], FNP while in the presence of [name], FNP.   Patient: Shane Hayes   DOB: 11/23/1971   51 y.o. Male  MRN: [id number] Visit Date: 06/16/2022  Today's healthcare provider: [name], FNP  Patient presents for new patient visit to establish care.  Introduced to Designer, jewellery role and practice setting.  All questions answered.  Discussed provider/patient relationship and expectations.   Chief Complaint  Patient presents with   follow-up Elevated BP   Subjective    HPI  Follow up for Elevate BP without diagnosis of Hypertension  The patient was last seen for this 3-6 months ago. Changes made at last visit include Diet and exercise and home blood pressure readings. Home reading are:not being checked.   BP Readings from Last 3 Encounters:  06/16/22 (!) 141/107  02/10/22 (!) 139/107  01/06/22 (!) 124/99    Wt Readings from Last 3 Encounters:  06/16/22 238 lb 6.4 oz (108.1 kg)  02/10/22 246 lb (111.6 kg)  01/06/22 243 lb 6.4 oz (110.4 kg)   -----------------------------------------------------------------------------------------   Medications: Outpatient Medications Prior to Visit  Medication Sig   Multiple Vitamin (MULTIVITAMIN) tablet Take 1 tablet by mouth daily.   No facility-administered medications prior to visit.    Review of Systems  Last CBC Lab Results  Component Value Date   WBC 7.8 12/05/2021   HGB 14.9 12/05/2021   HCT 45.4 12/05/2021   MCV 91.2 12/05/2021   MCH 29.9 12/05/2021   RDW 12.6 12/05/2021   PLT 293 03/70/4888   Last metabolic panel Lab Results  Component Value Date   GLUCOSE 97 12/17/2021   NA 141 12/17/2021   K 4.3 12/17/2021   CL 104 12/17/2021   CO2 25 12/17/2021   BUN 13 12/17/2021   CREATININE 1.14 12/17/2021    EGFR 78 12/17/2021   CALCIUM 10.5 (H) 12/17/2021   PROT 6.9 02/06/2021   ALBUMIN 4.6 02/06/2021   LABGLOB 2.3 02/06/2021   AGRATIO 2.0 02/06/2021   BILITOT 0.5 02/06/2021   ALKPHOS 73 02/06/2021   AST 26 02/06/2021   ALT 32 02/06/2021   ANIONGAP 6 12/05/2021   Last lipids Lab Results  Component Value Date   CHOL 166 12/17/2021   HDL 42 12/17/2021   LDLCALC 103 (H) 12/17/2021   TRIG 117 12/17/2021   CHOLHDL 4.0 12/17/2021   Last hemoglobin A1c Lab Results  Component Value Date   HGBA1C 5.2 12/17/2021   Last thyroid functions Lab Results  Component Value Date   TSH 0.618 12/17/2021       Objective    BP (!) 141/107 (BP Location: Left Arm, Patient Position: Sitting, Cuff Size: Large)   Temp 98 F (36.7 C) (Oral)   Resp 16   Ht _0  (1.651 m)   Wt 238 lb 6.4 oz (108.1 kg)   BMI 39.67 kg/m   BP Readings from Last 3 Encounters:  06/16/22 (!) 141/107  02/10/22 (!) 139/107  01/06/22 (!) 124/99   Wt Readings from Last 3 Encounters:  06/16/22 238 lb 6.4 oz (108.1 kg)  02/10/22 246 lb (111.6 kg)  01/06/22 243 lb 6.4 oz (110.4 kg)   SpO2 Readings from Last 3 Encounters:  02/10/22 98%  12/17/21 97%  12/05/21 98%      Physical  Exam Vitals and nursing note reviewed.  Constitutional:      Appearance: Normal appearance. He is obese.  HENT:     Head: Normocephalic and atraumatic.  Eyes:     Pupils: Pupils are equal, round, and reactive to light.  Cardiovascular:     Rate and Rhythm: Normal rate and regular rhythm.     Pulses: Normal pulses.     Heart sounds: Normal heart sounds.  Pulmonary:     Effort: Pulmonary effort is normal.     Breath sounds: Normal breath sounds.  Musculoskeletal:        General: Normal range of motion.     Cervical back: Normal range of motion.  Skin:    General: Skin is warm and dry.     Capillary Refill: Capillary refill takes less than 2 seconds.  Neurological:     General: No focal deficit present.     Mental Status: He is  alert and oriented to person, place, and time. Mental status is at baseline.  Psychiatric:        Attention and Perception: Attention normal.        Mood and Affect: Mood is anxious. Affect is flat.        Speech: Speech normal.        Behavior: Behavior normal.        Thought Content: Thought content normal.        Cognition and Memory: Cognition normal.      No results found for any visits on 06/16/22.  Assessment & Plan     Problem List Items Addressed This Visit       Cardiovascular and Mediastinum   Primary hypertension - Primary    Chronic, previous not treated/unstable Does not wish to start calcium channel blocker d/t concern for ankle edema Will start combo ARB/HCTZ; 4-6 month f/u encouraged Denies CP Denies SOB/ DOE Denies low blood pressure/hypotension; 'did not get along with BP cuff at home' Denies vision changes No LE Edema noted on exam Seek emergent care if you develop chest pain or chest pressure       Relevant Medications   losartan-hydrochlorothiazide (HYZAAR) 100-25 MG tablet     Other   Class 2 obesity with alveolar hypoventilation, serious comorbidity, and body mass index (BMI) of 39.0 to 39.9 in adult ([place])    Body mass index is 39.67 kg/m. Discussed importance of healthy weight management Discussed diet and exercise Associated with uncontrolled HTN and hx of ED        Return in about 6 weeks (around 07/28/2022) for HTN management.      Vonna Kotyk, FNP, have reviewed all documentation for this visit. The documentation on 06/16/22 for the exam, diagnosis, procedures, and orders are all accurate and complete.    [name], [place] [phone] (phone) [phone] (fax)  [place]

## 2022-06-16 NOTE — Assessment & Plan Note (Signed)
Body mass index is 39.67 kg/m. Discussed importance of healthy weight management Discussed diet and exercise Associated with uncontrolled HTN and hx of ED

## 2022-08-08 NOTE — Progress Notes (Unsigned)
    Established patient visit   Patient: Shane Hayes   DOB: 09/16/1971   51 y.o. Male  MRN: [id number] Visit Date: 08/11/2022  Today's healthcare provider: Elise T Payne, FNP  Re Introduced to nurse practitioner role and practice setting.  All questions answered.  Discussed provider/patient relationship and expectations.  I,Tiffany J Bragg,acting as a scribe for Elise T Payne, FNP.,have documented all relevant documentation on the behalf of Elise T Payne, FNP,as directed by  Elise T Payne, FNP while in the presence of Elise T Payne, FNP.   Chief Complaint  Patient presents with   Hypertension   Subjective    HPI  Hypertension, follow-up  BP Readings from Last 3 Encounters:  08/11/22 113/88  06/16/22 (!) 141/107  02/10/22 (!) 139/107   Wt Readings from Last 3 Encounters:  08/11/22 240 lb (108.9 kg)  06/16/22 238 lb 6.4 oz (108.1 kg)  02/10/22 246 lb (111.6 kg)     He was last seen for hypertension 6 weeks ago.  BP at that visit was 141/107. Management since that visit includes start combo ARB/HCTZ.  He reports excellent compliance with treatment. He is not having side effects.  He is following a Regular diet. He is exercising. He does smoke.  Outside blood pressures are not checked. Symptoms: No chest pain No chest pressure  No palpitations No syncope  No dyspnea No orthopnea  No paroxysmal nocturnal dyspnea No lower extremity edema   Pertinent labs Lab Results  Component Value Date   CHOL 166 12/17/2021   HDL 42 12/17/2021   LDLCALC 103 (H) 12/17/2021   TRIG 117 12/17/2021   CHOLHDL 4.0 12/17/2021   Lab Results  Component Value Date   NA 141 12/17/2021   K 4.3 12/17/2021   CREATININE 1.14 12/17/2021   EGFR 78 12/17/2021   GLUCOSE 97 12/17/2021   TSH 0.618 12/17/2021     The 10-year ASCVD risk score (Arnett DK, et al., 2019) is: 7.6%  ---------------------------------------------------------------------------------------------------    Medications: Outpatient Medications Prior to Visit  Medication Sig   losartan-hydrochlorothiazide (HYZAAR) 100-25 MG tablet Take 1 tablet by mouth daily.   meloxicam (MOBIC) 7.5 MG tablet Take 1 tablet twice a day by oral route.   metoprolol succinate (TOPROL-XL) 50 MG 24 hr tablet    Multiple Vitamin (MULTIVITAMIN) tablet Take 1 tablet by mouth daily.   No facility-administered medications prior to visit.    Review of Systems    Objective    BP 113/88 (BP Location: Left Arm, Patient Position: Sitting, Cuff Size: Large)   Pulse 86   Temp 98.7 F (37.1 C) (Oral)   Resp 16   Ht 5' 5" (1.651 m)   Wt 240 lb (108.9 kg)   BMI 39.94 kg/m   Physical Exam Vitals and nursing note reviewed.  Constitutional:      Appearance: Normal appearance. He is obese.  HENT:     Head: Normocephalic and atraumatic.  Cardiovascular:     Rate and Rhythm: Normal rate and regular rhythm.     Pulses: Normal pulses.     Heart sounds: Normal heart sounds.  Pulmonary:     Effort: Pulmonary effort is normal.     Breath sounds: Normal breath sounds.  Musculoskeletal:        General: No swelling. Normal range of motion.     Right lower leg: No edema.     Left lower leg: No edema.  Skin:    General: Skin is warm and   dry.     Capillary Refill: Capillary refill takes less than 2 seconds.  Neurological:     General: No focal deficit present.     Mental Status: He is alert and oriented to person, place, and time. Mental status is at baseline.  Psychiatric:        Mood and Affect: Mood normal.        Behavior: Behavior normal.        Thought Content: Thought content normal.        Judgment: Judgment normal.      No results found for any visits on 08/11/22.  Assessment & Plan     Problem List Items Addressed This Visit       Cardiovascular and Mediastinum   Primary hypertension - Primary    Chronic, much improved At goal of <140/<90 Continue Hyzaar 100-25 RTC in 4 months for 6 month f/u  from initial visit; per pt request      Relevant Medications   metoprolol succinate (TOPROL-XL) 50 MG 24 hr tablet     Return in about 4 months (around 12/11/2022) for chonic disease management.      Vonna Kotyk, FNP, have reviewed all documentation for this visit. The documentation on 08/11/22 for the exam, diagnosis, procedures, and orders are all accurate and complete.    [name], [place] [phone] (phone) [phone] (fax)  [place]

## 2022-08-11 ENCOUNTER — Encounter: Payer: Self-pay | Admitting: Family Medicine

## 2022-08-11 ENCOUNTER — Ambulatory Visit: Payer: BC Managed Care – PPO | Admitting: Family Medicine

## 2022-08-11 VITALS — BP 113/88 | HR 86 | Temp 98.7°F | Resp 16 | Ht 65.0 in | Wt 240.0 lb

## 2022-08-11 DIAGNOSIS — I10 Essential (primary) hypertension: Secondary | ICD-10-CM

## 2022-08-11 NOTE — Assessment & Plan Note (Signed)
Chronic, much improved At goal of <140/<90 Continue Hyzaar 100-25 RTC in 4 months for 6 month f/u from initial visit; per pt request

## 2022-12-15 NOTE — Progress Notes (Signed)
I,Connie R Striblin,acting as a Education administrator for [name], FNP.,have documented all relevant documentation on the behalf of [name], FNP,as directed by  [name], FNP while in the presence of [name], FNP.  Established patient visit  Patient: Shane Hayes   DOB: 1971/07/22   52 y.o. Male  MRN: [id number] Visit Date: 12/16/2022  Today's healthcare provider: [name], FNP  Re Introduced to nurse practitioner role and practice setting.  All questions answered.  Discussed provider/patient relationship and expectations.  Subjective    HPI  Hypertension, follow-up  BP Readings from Last 3 Encounters:  12/16/22 113/88  08/11/22 113/88  06/16/22 (!) 141/107   Wt Readings from Last 3 Encounters:  12/16/22 243 lb (110.2 kg)  08/11/22 240 lb (108.9 kg)  06/16/22 238 lb 6.4 oz (108.1 kg)     He was last seen for hypertension 4 months ago.  BP at that visit was 113/88. Management since that visit includes Continue Hyzaar 100-25 .  He reports poor compliance with treatment. He notes "being hard headed." He is not having side effects.  He is following a Regular diet. He is not exercising. He does smoke. Endorses occasional social use.  Pertinent labs Lab Results  Component Value Date   CHOL 166 12/17/2021   HDL 42 12/17/2021   LDLCALC 103 (H) 12/17/2021   TRIG 117 12/17/2021   CHOLHDL 4.0 12/17/2021   Lab Results  Component Value Date   NA 141 12/17/2021   K 4.3 12/17/2021   CREATININE 1.14 12/17/2021   EGFR 78 12/17/2021   GLUCOSE 97 12/17/2021   TSH 0.618 12/17/2021     The 10-year ASCVD risk score (Arnett DK, et al., 2019) is: 7.6%  ---------------------------------------------------------------------------------------------------   Medications: Outpatient Medications Prior to Visit  Medication Sig   Multiple Vitamin (MULTIVITAMIN) tablet Take 1 tablet by mouth daily.   [DISCONTINUED] losartan-hydrochlorothiazide (HYZAAR) 100-25 MG tablet  Take 1 tablet by mouth daily. (Patient not taking: Reported on 12/16/2022)   [DISCONTINUED] meloxicam (MOBIC) 7.5 MG tablet Take 1 tablet twice a day by oral route. (Patient not taking: Reported on 12/16/2022)   [DISCONTINUED] metoprolol succinate (TOPROL-XL) 50 MG 24 hr tablet  (Patient not taking: Reported on 12/16/2022)   No facility-administered medications prior to visit.    Review of Systems    Objective    BP 113/88 Comment: previous reading to assist ASCVD risk  Pulse 98   Temp 98.2 F (36.8 C) (Oral)   Wt 243 lb (110.2 kg)   SpO2 98%   BMI 40.44 kg/m   Physical Exam Vitals and nursing note reviewed.  Constitutional:      Appearance: Normal appearance. He is obese.  HENT:     Head: Normocephalic and atraumatic.  Eyes:     Pupils: Pupils are equal, round, and reactive to light.  Cardiovascular:     Rate and Rhythm: Normal rate and regular rhythm.     Pulses: Normal pulses.     Heart sounds: Normal heart sounds.  Pulmonary:     Effort: Pulmonary effort is normal.     Breath sounds: Normal breath sounds.  Musculoskeletal:        General: Normal range of motion.     Cervical back: Normal range of motion.     Right lower leg: No edema.     Left lower leg: No edema.  Skin:    General: Skin is warm and dry.     Capillary Refill:  Capillary refill takes less than 2 seconds.  Neurological:     General: No focal deficit present.     Mental Status: He is alert and oriented to person, place, and time. Mental status is at baseline.  Psychiatric:        Mood and Affect: Mood normal.        Behavior: Behavior normal.        Thought Content: Thought content normal.        Judgment: Judgment normal.     No results found for any visits on 12/16/22.  Assessment & Plan     Problem List Items Addressed This Visit       Cardiovascular and Mediastinum   Primary hypertension - Primary    Chronic, elevated without use of anti-hypertensive  Denies side effects on either  medication; however, reports being "hard headed" Agreeable to restart and work on dietary control with use of DASH diet  Return to clinic with CPE in 3/24      Relevant Medications   losartan-hydrochlorothiazide (HYZAAR) 100-25 MG tablet   metoprolol succinate (TOPROL-XL) 50 MG 24 hr tablet   Other Relevant Orders   Comprehensive Metabolic Panel (CMET)   CBC     Nervous and Auditory   Bilateral carpal tunnel syndrome    Acute on chronic, R exacerbated  Discussed stretches, braces, ortho referral Previously had sx on L Reports overuse due to multiple jobs Discusses use of high dose NSAID ex ibu 800 mg q 8 x 3 days if flared      Relevant Orders   Ambulatory referral to Orthopedic Surgery     Other   Elevated LDL cholesterol level    Repeat LP Continue to recommend diet low in saturated fat and regular exercise - 30 min at least 5 times per week Goal <100 LDL; chronic, previously elevated without medication start per pt request      Relevant Orders   Lipid panel   Morbid obesity ([place])    Chronic, slightly worse Body mass index is 40.44 kg/m. Continue to recommend balanced, lower carb meals. Smaller meal size, adding snacks. Choosing water as drink of choice and increasing purposeful exercise.       Screening PSA (prostate specific antigen)    Denies LUTS; recommend PSA in place of DRE. If PSA is elevated for age, we will repeat; if PSA remains elevated pt will be referred to urology for DRE and next steps for best treatment.       Relevant Orders   PSA   Situational stress    Repots acute on chronic concerns with previous partners trying to reconnect; denies assistance at this time Continue to monitor      Return in about 2 months (around 02/14/2023) for annual examination.     Vonna Kotyk, FNP, have reviewed all documentation for this visit. The documentation on 12/16/22 for the exam, diagnosis, procedures, and orders are all accurate and complete.  [name], [place] [phone] (phone) [phone] (fax)  [place]

## 2022-12-16 ENCOUNTER — Encounter: Payer: Self-pay | Admitting: Family Medicine

## 2022-12-16 ENCOUNTER — Ambulatory Visit: Payer: BC Managed Care – PPO | Admitting: Family Medicine

## 2022-12-16 VITALS — BP 113/88 | HR 98 | Temp 98.2°F | Wt 243.0 lb

## 2022-12-16 DIAGNOSIS — G5603 Carpal tunnel syndrome, bilateral upper limbs: Secondary | ICD-10-CM | POA: Insufficient documentation

## 2022-12-16 DIAGNOSIS — F439 Reaction to severe stress, unspecified: Secondary | ICD-10-CM | POA: Insufficient documentation

## 2022-12-16 DIAGNOSIS — I10 Essential (primary) hypertension: Secondary | ICD-10-CM | POA: Diagnosis not present

## 2022-12-16 DIAGNOSIS — E7800 Pure hypercholesterolemia, unspecified: Secondary | ICD-10-CM

## 2022-12-16 DIAGNOSIS — Z125 Encounter for screening for malignant neoplasm of prostate: Secondary | ICD-10-CM | POA: Diagnosis not present

## 2022-12-16 MED ORDER — METOPROLOL SUCCINATE ER 50 MG PO TB24: 50.0000 mg | ORAL_TABLET | Freq: Every day | ORAL | 3 refills | Status: DC

## 2022-12-16 MED ORDER — LOSARTAN POTASSIUM-HCTZ 100-25 MG PO TABS: 1.0000 | ORAL_TABLET | Freq: Every day | ORAL | 3 refills | Status: DC

## 2022-12-16 NOTE — Assessment & Plan Note (Signed)
Chronic, slightly worse Body mass index is 40.44 kg/m. Continue to recommend balanced, lower carb meals. Smaller meal size, adding snacks. Choosing water as drink of choice and increasing purposeful exercise.

## 2022-12-16 NOTE — Assessment & Plan Note (Signed)
Chronic, elevated without use of anti-hypertensive  Denies side effects on either medication; however, reports being "hard headed" Agreeable to restart and work on dietary control with use of DASH diet  Return to clinic with CPE in 3/24

## 2022-12-16 NOTE — Assessment & Plan Note (Signed)
Repots acute on chronic concerns with previous partners trying to reconnect; denies assistance at this time Continue to monitor

## 2022-12-16 NOTE — Assessment & Plan Note (Addendum)
Acute on chronic, R exacerbated  Discussed stretches, braces, ortho referral Previously had sx on L Reports overuse due to multiple jobs Discusses use of high dose NSAID ex ibu 800 mg q 8 x 3 days if flared

## 2022-12-16 NOTE — Patient Instructions (Addendum)
The 10-year ASCVD risk score (Arnett DK, et al., 2019) is: 12%   Values used to calculate the score:     Age: 52 years     Sex: Male     Is Non-Hispanic African American: Yes     Diabetic: No     Tobacco smoker: No     Systolic Blood Pressure: 300 mmHg     Is BP treated: Yes     HDL Cholesterol: 42 mg/dL     Total Cholesterol: 166 mg/dL  The 10-year ASCVD risk score (Arnett DK, et al., 2019) is: 7.6%   Values used to calculate the score:     Age: 40 years     Sex: Male     Is Non-Hispanic African American: Yes     Diabetic: No     Tobacco smoker: No     Systolic Blood Pressure: 762 mmHg     Is BP treated: Yes     HDL Cholesterol: 42 mg/dL     Total Cholesterol: 166 mg/dL  The CDC recommends two doses of Shingrix (the new shingles vaccine) separated by 2 to 6 months for adults age 32 years and older. I recommend checking with your insurance plan regarding coverage for this vaccine.

## 2022-12-16 NOTE — Assessment & Plan Note (Signed)
Denies LUTS; recommend PSA in place of DRE. If PSA is elevated for age, we will repeat; if PSA remains elevated pt will be referred to urology for DRE and next steps for best treatment.  

## 2022-12-16 NOTE — Assessment & Plan Note (Addendum)
Repeat LP Continue to recommend diet low in saturated fat and regular exercise - 30 min at least 5 times per week Goal <100 LDL; chronic, previously elevated without medication start per pt request

## 2022-12-17 LAB — COMPREHENSIVE METABOLIC PANEL
ALT: 42 IU/L (ref 0–44)
AST: 25 IU/L (ref 0–40)
Albumin/Globulin Ratio: 2 (ref 1.2–2.2)
Albumin: 4.7 g/dL (ref 3.8–4.9)
Alkaline Phosphatase: 72 IU/L (ref 44–121)
BUN/Creatinine Ratio: 9 (ref 9–20)
BUN: 12 mg/dL (ref 6–24)
Bilirubin Total: 0.6 mg/dL (ref 0.0–1.2)
CO2: 23 mmol/L (ref 20–29)
Calcium: 10.3 mg/dL — ABNORMAL HIGH (ref 8.7–10.2)
Chloride: 105 mmol/L (ref 96–106)
Creatinine, Ser: 1.35 mg/dL — ABNORMAL HIGH (ref 0.76–1.27)
Globulin, Total: 2.3 g/dL (ref 1.5–4.5)
Glucose: 109 mg/dL — ABNORMAL HIGH (ref 70–99)
Potassium: 4 mmol/L (ref 3.5–5.2)
Sodium: 143 mmol/L (ref 134–144)
Total Protein: 7 g/dL (ref 6.0–8.5)
eGFR: 63 mL/min/{1.73_m2} (ref >59)

## 2022-12-17 LAB — CBC
Hematocrit: 46.8 % (ref 37.5–51.0)
Hemoglobin: 15.8 g/dL (ref 13.0–17.7)
MCH: 30.4 pg (ref 26.6–33.0)
MCHC: 33.8 g/dL (ref 31.5–35.7)
MCV: 90 fL (ref 79–97)
Platelets: 280 10*3/uL (ref 150–450)
RBC: 5.19 x10E6/uL (ref 4.14–5.80)
RDW: 11.7 % (ref 11.6–15.4)
WBC: 8.7 10*3/uL (ref 3.4–10.8)

## 2022-12-17 LAB — PSA: Prostate Specific Ag, Serum: 1.6 ng/mL (ref 0.0–4.0)

## 2022-12-17 LAB — LIPID PANEL
Chol/HDL Ratio: 3.4 ratio (ref 0.0–5.0)
Cholesterol, Total: 142 mg/dL (ref 100–199)
HDL: 42 mg/dL (ref >39)
LDL Chol Calc (NIH): 87 mg/dL (ref 0–99)
Triglycerides: 61 mg/dL (ref 0–149)
VLDL Cholesterol Cal: 13 mg/dL (ref 5–40)

## 2022-12-17 NOTE — Progress Notes (Signed)
Chemistry -shows elevated glucose -elevated creatinine--> focus on blood pressure control and water intake (goal of minimum 48 oz/day) -borderline elevated calcium   Lipid -remains improved

## 2022-12-30 DIAGNOSIS — G5601 Carpal tunnel syndrome, right upper limb: Secondary | ICD-10-CM | POA: Diagnosis not present

## 2023-01-06 IMAGING — CR DG CHEST 2V
2 series · 2 of 2 positions shown · non-contrast
Comparison: 02/26/2021

CLINICAL DATA: Patient presents for syncopal episode while at work.
Patient states no symptoms prior to passing out. Patient denies this
happening before. Hx of htn.

EXAM:
CHEST - 2 VIEW

[chest pa]
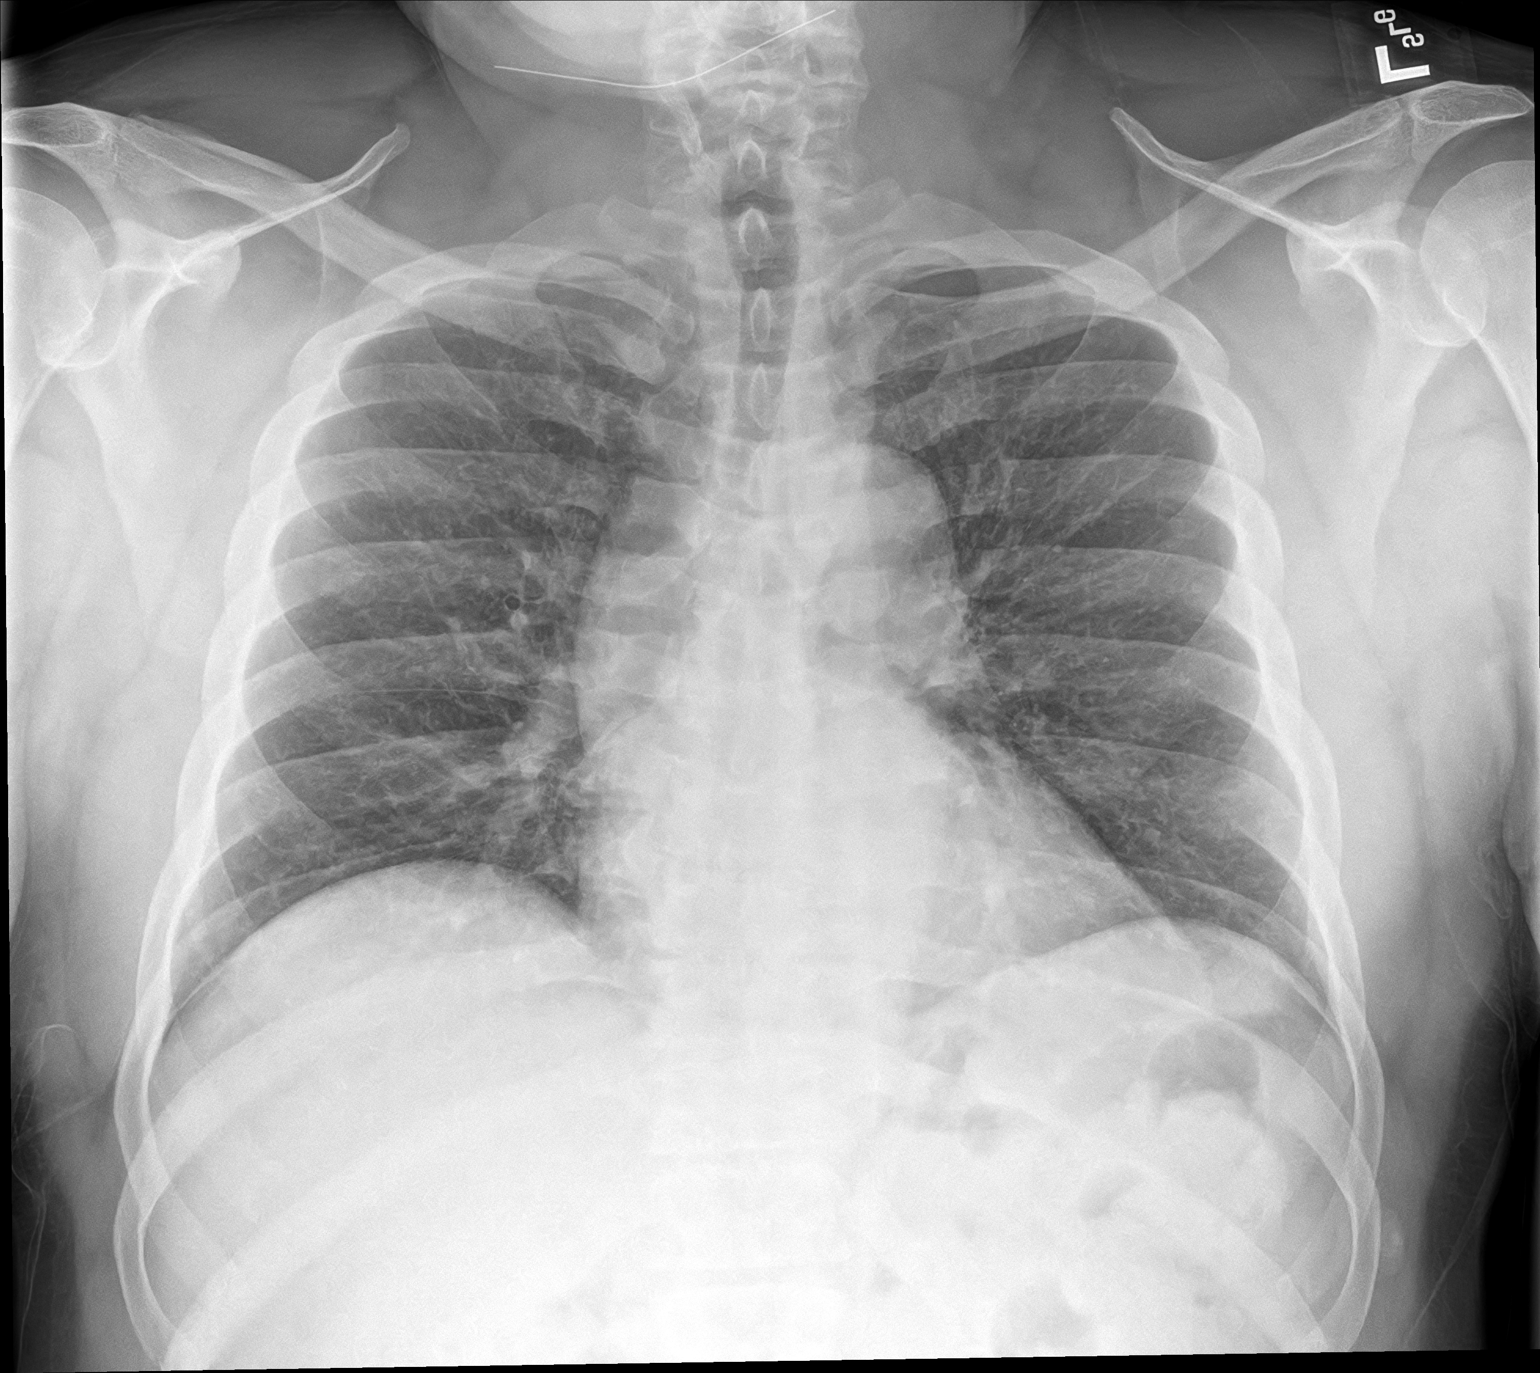

[chest lat]
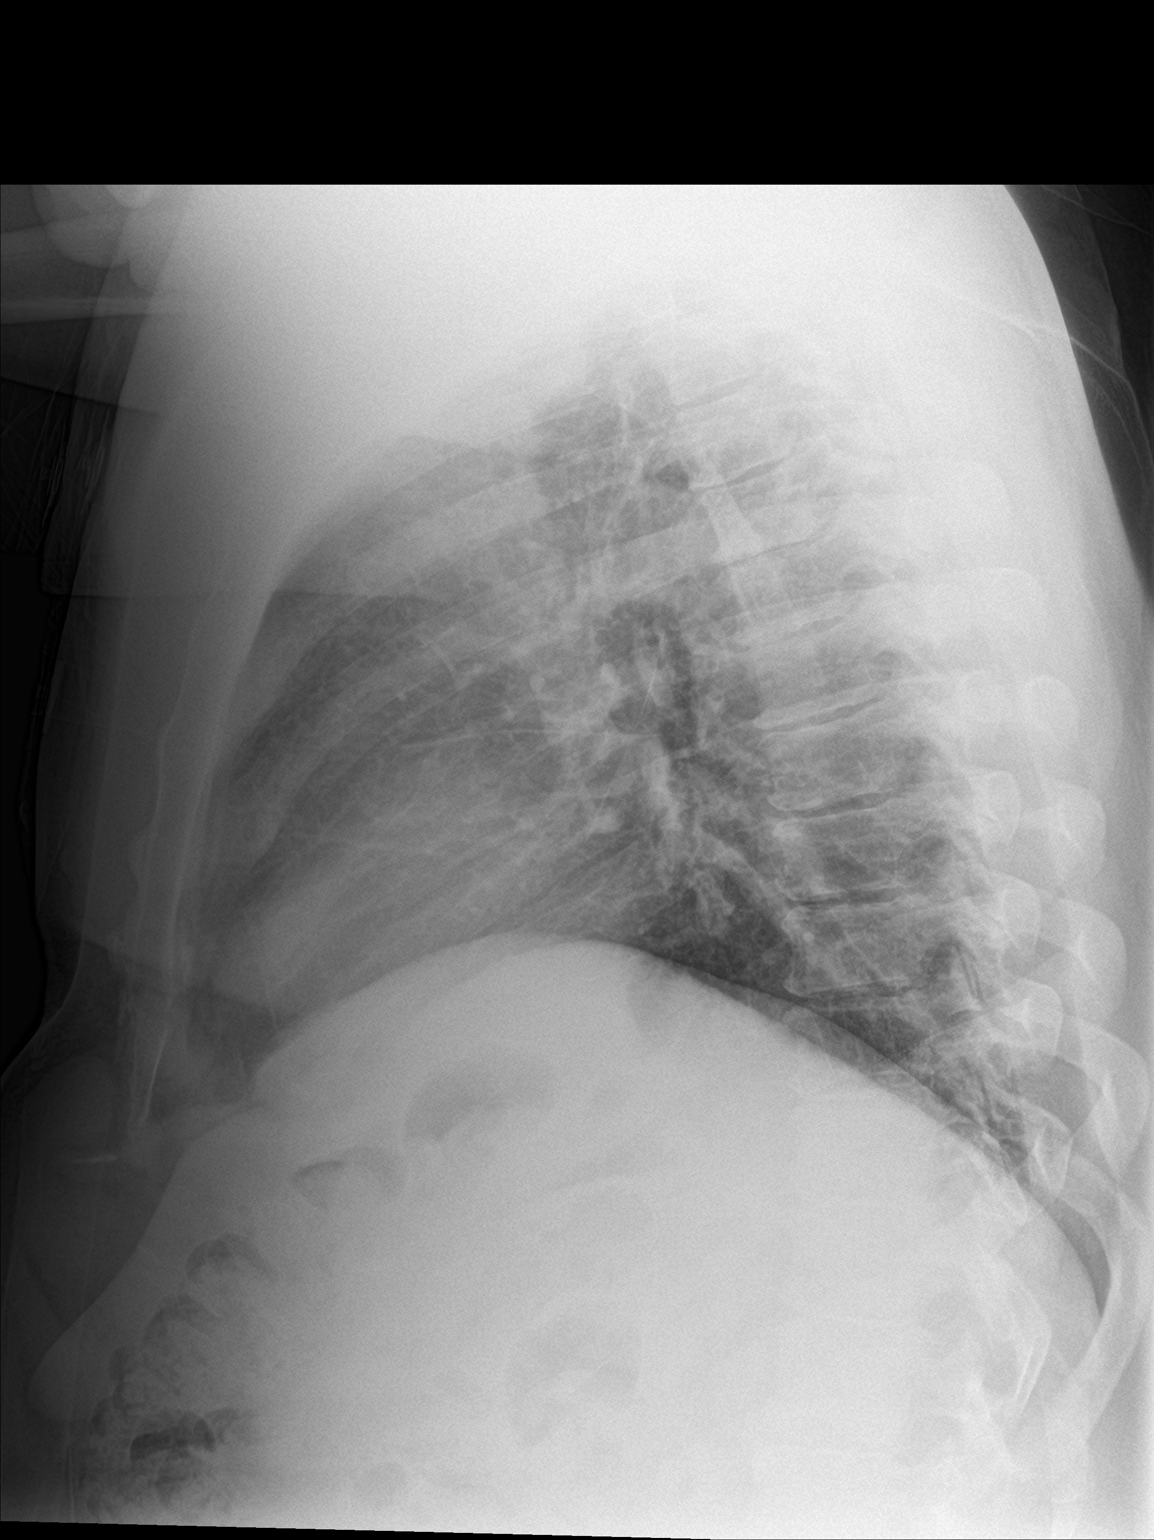

[2 of 2 positions shown; findings below may reference images not displayed]

FINDINGS: Lungs are clear.

Heart size and mediastinal contours are within normal limits.

No effusion.

Visualized bones unremarkable.
IMPRESSION: No acute cardiopulmonary disease.

## 2023-02-10 DIAGNOSIS — G5601 Carpal tunnel syndrome, right upper limb: Secondary | ICD-10-CM | POA: Diagnosis not present

## 2023-02-13 ENCOUNTER — Encounter: Payer: BC Managed Care – PPO | Admitting: Family Medicine

## 2023-02-16 NOTE — Progress Notes (Deleted)
Complete physical exam   Patient: Shane Hayes   DOB: 1971/05/19   52 y.o. Male  MRN: [id number] Visit Date: 02/17/2023  Today's healthcare provider: [name], FNP   No chief complaint on file.  Subjective    Shane Hayes is a 52 y.o. male who presents today for a complete physical exam.  He reports consuming a {diet types:17450} diet. {Exercise:19826} He generally feels {well/fairly well/poorly:18703}. He reports sleeping {well/fairly well/poorly:18703}. He {does/does not:200015} have additional problems to discuss today.  HPI  ***  Past Medical History:  Diagnosis Date   Erectile dysfunction    HTN (hypertension)    Past Surgical History:  Procedure Laterality Date   CARPAL TUNNEL RELEASE Left 2013   COLONOSCOPY WITH PROPOFOL N/A 03/20/2021   Procedure: COLONOSCOPY WITH PROPOFOL;  Surgeon: Lin Landsman, MD;  Location[facility] ENDOSCOPY;  Service: Gastroenterology;  Laterality: N/A;   Social History   Socioeconomic History   Marital status: Divorced    Spouse name: Not on file   Number of children: Not on file   Years of education: Not on file   Highest education level: Not on file  Occupational History   Not on file  Tobacco Use   Smoking status: Former    Types: Cigars    Quit date: 05/2018    Years since quitting: 4.7   Smokeless tobacco: Never  Vaping Use   Vaping Use: Never used  Substance and Sexual Activity   Alcohol use: Yes    Alcohol/week: 0.0 standard drinks of alcohol    Comment: none last 24hrs   Drug use: No   Sexual activity: Yes    Birth control/protection: None  Other Topics Concern   Not on file  Social History Narrative   Not on file   Social Determinants of Health   Financial Resource Strain: Not on file  Food Insecurity: Not on file  Transportation Needs: Not on file  Physical Activity: Not on file  Stress: Not on file  Social Connections: Not on file  Intimate Partner Violence: Not on file   Family Status   Relation Name Status   Mother  Alive   Father  Other   Sister  Alive   Daughter  Alive   Son  Alive   MGM  Deceased   MGF  Deceased       died from complications from pneumonia   PGM  Other   PGF  Other   Daughter  Alive   Family History  Problem Relation Age of Onset   Healthy Mother    Healthy Sister    Healthy Daughter    Healthy Son    Congestive Heart Failure Maternal Grandmother    Healthy Daughter    No Known Allergies  Patient Care Team: [name], FNP as PCP - General (Family Medicine)   Medications: Outpatient Medications Prior to Visit  Medication Sig   losartan-hydrochlorothiazide (HYZAAR) 100-25 MG tablet Take 1 tablet by mouth daily.   metoprolol succinate (TOPROL-XL) 50 MG 24 hr tablet Take 1 tablet (50 mg total) by mouth daily.   Multiple Vitamin (MULTIVITAMIN) tablet Take 1 tablet by mouth daily.   No facility-administered medications prior to visit.    Review of Systems  {Labs  Heme  Chem  Endocrine  Serology  Results Review (optional):23779}  Objective    There were no vitals taken for this visit. {Show previous vital signs (optional):23777}   Physical Exam  ***  Last depression screening scores    12/16/2022    9:08 AM 08/11/2022    9:06 AM 06/16/2022    9:15 AM  PHQ 2/9 Scores  PHQ - 2 Score 0 0 0  PHQ- 9 Score 1 0 1   Last fall risk screening    12/16/2022    9:08 AM  Fall Risk   Falls in the past year? 0  Number falls in past yr: 0  Injury with Fall? 0   Last Audit-C alcohol use screening    12/16/2022    9:08 AM  Alcohol Use Disorder Test (AUDIT)  1. How often do you have a drink containing alcohol? 2  2. How many drinks containing alcohol do you have on a typical day when you are drinking? 0  3. How often do you have six or more drinks on one occasion? 0  AUDIT-C Score 2   A score of 3 or more in women, and 4 or more in men indicates increased risk for alcohol abuse, EXCEPT if all of the points are from  question 1   No results found for any visits on 02/17/23.  Assessment & Plan    Routine Health Maintenance and Physical Exam  Exercise Activities and Dietary recommendations  Goals   None     Immunization History  Administered Date(s) Administered   Influenza,inj,Quad PF,6+ Mos 08/28/2017, 11/05/2018   Tdap 12/23/2013    Health Maintenance  Topic Date Due   COVID-19 Vaccine (1) Never done   Zoster Vaccines- Shingrix (1 of 2) Never done   INFLUENZA VACCINE  02/22/2023 (Originally 06/24/2022)   DTaP/Tdap/Td (2 - Td or Tdap) 12/24/2023   COLONOSCOPY (Pts 45-21yrs Insurance coverage will need to be confirmed)  03/21/2031   Hepatitis C Screening  Completed   HIV Screening  Completed   HPV VACCINES  Aged Out    Discussed health benefits of physical activity, and encouraged him to engage in regular exercise appropriate for his age and condition.  ***  No follow-ups on file.     {provider attestation***:1}   [name], [place] [phone] (phone) [phone] (fax)  [place]

## 2023-02-17 ENCOUNTER — Encounter: Payer: BC Managed Care – PPO | Admitting: Family Medicine

## 2023-02-24 ENCOUNTER — Ambulatory Visit (INDEPENDENT_AMBULATORY_CARE_PROVIDER_SITE_OTHER): Payer: BC Managed Care – PPO | Admitting: Family Medicine

## 2023-02-24 ENCOUNTER — Encounter: Payer: Self-pay | Admitting: Family Medicine

## 2023-02-24 VITALS — BP 119/84 | HR 98 | Temp 98.8°F | Resp 16 | Ht 65.0 in | Wt 246.3 lb

## 2023-02-24 DIAGNOSIS — Z0000 Encounter for general adult medical examination without abnormal findings: Secondary | ICD-10-CM | POA: Diagnosis not present

## 2023-02-24 DIAGNOSIS — R7989 Other specified abnormal findings of blood chemistry: Secondary | ICD-10-CM

## 2023-02-24 DIAGNOSIS — Z1211 Encounter for screening for malignant neoplasm of colon: Secondary | ICD-10-CM | POA: Diagnosis not present

## 2023-02-24 DIAGNOSIS — I10 Essential (primary) hypertension: Secondary | ICD-10-CM | POA: Diagnosis not present

## 2023-02-24 DIAGNOSIS — R739 Hyperglycemia, unspecified: Secondary | ICD-10-CM | POA: Diagnosis not present

## 2023-02-24 NOTE — Assessment & Plan Note (Signed)
Previously noted poor prep prior to colon cancer screening; will resend referral to reestablish at this time

## 2023-02-24 NOTE — Assessment & Plan Note (Signed)
Check A1c given obesity and previous elevated glucose Continue to recommend balanced, lower carb meals. Smaller meal size, adding snacks. Choosing water as drink of choice and increasing purposeful exercise.

## 2023-02-24 NOTE — Assessment & Plan Note (Addendum)
Chronic, stable Continue Hyzaar 100-25 and Metop XL 50 to assist; keep previous doses  Repeat CMP given previous elevated creatinine

## 2023-02-24 NOTE — Assessment & Plan Note (Addendum)
Chronic, stable With associated HTN and HLD  Body mass index is 40.99 kg/m. Discussed importance of healthy weight management Discussed diet and exercise

## 2023-02-24 NOTE — Patient Instructions (Signed)
The CDC recommends two doses of Shingrix (the new shingles vaccine) separated by 2 to 6 months for adults age 53 years and older. I recommend checking with your insurance plan regarding coverage for this vaccine.    

## 2023-02-24 NOTE — Progress Notes (Signed)
I,Joseline E Rosas,acting as a scribe for [name], FNP.,have documented all relevant documentation on the behalf of [name], FNP,as directed by  [name], FNP while in the presence of [name], FNP.   Complete physical exam   Patient: Shane Hayes   DOB: 21-Jun-1971   52 y.o. Male  MRN: [id number] Visit Date: 02/24/2023  Today's healthcare provider: [name], FNP   Chief Complaint  Patient presents with   Annual Exam   Subjective    Shane Hayes is a 52 y.o. male who presents today for a complete physical exam.  He reports consuming a general diet. The patient does not participate in regular exercise at present. He generally feels well. He reports sleeping fairly well. He does not have additional problems to discuss today.  HPI  Covid Vaccine: Declined Shingles Vaccine: Declined  Past Medical History:  Diagnosis Date   Erectile dysfunction    HTN (hypertension)    Past Surgical History:  Procedure Laterality Date   CARPAL TUNNEL RELEASE Left 2013   COLONOSCOPY WITH PROPOFOL N/A 03/20/2021   Procedure: COLONOSCOPY WITH PROPOFOL;  Surgeon: Lin Landsman, MD;  Location: [facility];  Service: Gastroenterology;  Laterality: N/A;   Social History   Socioeconomic History   Marital status: Divorced    Spouse name: Not on file   Number of children: Not on file   Years of education: Not on file   Highest education level: Not on file  Occupational History   Not on file  Tobacco Use   Smoking status: Former    Types: Cigars    Quit date: 05/2018    Years since quitting: 4.7   Smokeless tobacco: Never  Vaping Use   Vaping Use: Never used  Substance and Sexual Activity   Alcohol use: Yes    Alcohol/week: 0.0 standard drinks of alcohol    Comment: none last 24hrs   Drug use: No   Sexual activity: Yes    Birth control/protection: None  Other Topics Concern   Not on file  Social History Narrative   Not on file   Social  Determinants of Health   Financial Resource Strain: Not on file  Food Insecurity: Not on file  Transportation Needs: Not on file  Physical Activity: Not on file  Stress: Not on file  Social Connections: Not on file  Intimate Partner Violence: Not on file   Family Status  Relation Name Status   Mother  Alive   Father  Other   Sister  Alive   Daughter  Alive   Son  Alive   MGM  Deceased   MGF  Deceased       died from complications from pneumonia   PGM  Other   PGF  Other   Daughter  Alive   Family History  Problem Relation Age of Onset   Healthy Mother    Healthy Sister    Healthy Daughter    Healthy Son    Congestive Heart Failure Maternal Grandmother    Healthy Daughter    No Known Allergies  Patient Care Team: [name], FNP as PCP - General (Family Medicine)   Medications: Outpatient Medications Prior to Visit  Medication Sig   losartan-hydrochlorothiazide (HYZAAR) 100-25 MG tablet Take 1 tablet by mouth daily.   metoprolol succinate (TOPROL-XL) 50 MG 24 hr tablet Take 1 tablet (50 mg total) by mouth daily.   Multiple Vitamin (MULTIVITAMIN) tablet Take  1 tablet by mouth daily.   No facility-administered medications prior to visit.    Review of Systems  Allergic/Immunologic: Positive for environmental allergies.  All other systems reviewed and are negative.     Objective    BP 119/84 (BP Location: Left Arm, Patient Position: Sitting, Cuff Size: Large)   Pulse 98   Temp 98.8 F (37.1 C) (Oral)   Resp 16   Ht 5\' 5"  (1.651 m)   Wt 246 lb 4.8 oz (111.7 kg)   BMI 40.99 kg/m     Physical Exam Vitals and nursing note reviewed.  Constitutional:      General: He is awake. He is not in acute distress.    Appearance: Normal appearance. He is well-developed and well-groomed. He is obese. He is not ill-appearing, toxic-appearing or diaphoretic.  HENT:     Head: Normocephalic and atraumatic.     Jaw: There is normal jaw occlusion. No trismus,  tenderness, swelling or pain on movement.     Salivary Glands: Right salivary gland is not diffusely enlarged or tender. Left salivary gland is not diffusely enlarged or tender.     Right Ear: Hearing, tympanic membrane, ear canal and external ear normal. There is no impacted cerumen.     Left Ear: Hearing, tympanic membrane, ear canal and external ear normal. There is no impacted cerumen.     Nose: Nose normal. No congestion or rhinorrhea.     Right Turbinates: Not enlarged, swollen or pale.     Left Turbinates: Not enlarged, swollen or pale.     Right Sinus: No maxillary sinus tenderness or frontal sinus tenderness.     Left Sinus: No maxillary sinus tenderness or frontal sinus tenderness.     Mouth/Throat:     Lips: Pink.     Mouth: Mucous membranes are moist. No injury, lacerations, oral lesions or angioedema.     Pharynx: Oropharynx is clear. Uvula midline. No pharyngeal swelling, oropharyngeal exudate or posterior oropharyngeal erythema.     Tonsils: No tonsillar exudate or tonsillar abscesses.  Eyes:     General: Lids are normal. Vision grossly intact. Gaze aligned appropriately.        Right eye: No discharge.        Left eye: No discharge.     Extraocular Movements: Extraocular movements intact.     Conjunctiva/sclera: Conjunctivae normal.     Pupils: Pupils are equal, round, and reactive to light.  Neck:     Thyroid: No thyroid mass, thyromegaly or thyroid tenderness.     Vascular: No carotid bruit.     Trachea: Trachea normal. No tracheal tenderness.  Cardiovascular:     Rate and Rhythm: Normal rate and regular rhythm.     Pulses: Normal pulses.          Carotid pulses are 2+ on the right side and 2+ on the left side.      Radial pulses are 2+ on the right side and 2+ on the left side.       Femoral pulses are 2+ on the right side and 2+ on the left side.      Popliteal pulses are 2+ on the right side and 2+ on the left side.       Dorsalis pedis pulses are 2+ on the right  side and 2+ on the left side.       Posterior tibial pulses are 2+ on the right side and 2+ on the left side.     Heart sounds:  Normal heart sounds, S1 normal and S2 normal. No murmur heard.    No friction rub. No gallop.  Pulmonary:     Effort: Pulmonary effort is normal. No respiratory distress.     Breath sounds: Normal breath sounds and air entry. No stridor. No wheezing, rhonchi or rales.  Chest:     Chest wall: No tenderness.  Abdominal:     General: Abdomen is flat. Bowel sounds are normal. There is no distension.     Palpations: Abdomen is soft. There is no mass.     Tenderness: There is no abdominal tenderness. There is no guarding or rebound.     Hernia: No hernia is present.  Genitourinary:    Comments: Exam deferred; denies complaints Musculoskeletal:        General: No swelling, tenderness, deformity or signs of injury. Normal range of motion.     Cervical back: Normal range of motion and neck supple. No rigidity or tenderness.     Right lower leg: No edema.     Left lower leg: No edema.  Lymphadenopathy:     Cervical: No cervical adenopathy.     Right cervical: No superficial, deep or posterior cervical adenopathy.    Left cervical: No superficial, deep or posterior cervical adenopathy.  Skin:    General: Skin is warm and dry.     Capillary Refill: Capillary refill takes less than 2 seconds.     Coloration: Skin is not jaundiced or pale.     Findings: No bruising, erythema, lesion or rash.  Neurological:     General: No focal deficit present.     Mental Status: He is alert and oriented to person, place, and time. Mental status is at baseline.     GCS: GCS eye subscore is 4. GCS verbal subscore is 5. GCS motor subscore is 6.     Sensory: Sensation is intact. No sensory deficit.     Motor: Motor function is intact. No weakness.     Coordination: Coordination is intact.     Gait: Gait is intact.  Psychiatric:        Attention and Perception: Attention and perception  normal.        Mood and Affect: Mood and affect normal.        Speech: Speech normal.        Behavior: Behavior normal. Behavior is cooperative.        Thought Content: Thought content normal.        Cognition and Memory: Cognition normal.        Judgment: Judgment normal.     Last depression screening scores    02/24/2023    8:54 AM 12/16/2022    9:08 AM 08/11/2022    9:06 AM  PHQ 2/9 Scores  PHQ - 2 Score 0 0 0  PHQ- 9 Score  1 0   Last fall risk screening    02/24/2023    8:53 AM  Fall Risk   Falls in the past year? 0  Number falls in past yr: 0  Injury with Fall? 0  Risk for fall due to : No Fall Risks   Last Audit-C alcohol use screening    02/24/2023    8:54 AM  Alcohol Use Disorder Test (AUDIT)  1. How often do you have a drink containing alcohol? 3  2. How many drinks containing alcohol do you have on a typical day when you are drinking? 0  3. How often do you have six or  more drinks on one occasion? 0  AUDIT-C Score 3  4. How often during the last year have you found that you were not able to stop drinking once you had started? 0  5. How often during the last year have you failed to do what was normally expected from you because of drinking? 0  6. How often during the last year have you needed a first drink in the morning to get yourself going after a heavy drinking session? 0  7. How often during the last year have you had a feeling of guilt of remorse after drinking? 0  8. How often during the last year have you been unable to remember what happened the night before because you had been drinking? 0  9. Have you or someone else been injured as a result of your drinking? 0  10. Has a relative or friend or a doctor or another health worker been concerned about your drinking or suggested you cut down? 0  Alcohol Use Disorder Identification Test Final Score (AUDIT) 3   A score of 3 or more in women, and 4 or more in men indicates increased risk for alcohol abuse, EXCEPT  if all of the points are from question 1   No results found for any visits on 02/24/23.  Assessment & Plan    Routine Health Maintenance and Physical Exam  Exercise Activities and Dietary recommendations  Goals   None     Immunization History  Administered Date(s) Administered   Influenza,inj,Quad PF,6+ Mos 08/28/2017, 11/05/2018   Tdap 12/23/2013    Health Maintenance  Topic Date Due   COVID-19 Vaccine (1) Never done   Zoster Vaccines- Shingrix (1 of 2) Never done   INFLUENZA VACCINE  06/25/2023   DTaP/Tdap/Td (2 - Td or Tdap) 12/24/2023   COLONOSCOPY (Pts 45-20yrs Insurance coverage will need to be confirmed)  03/21/2031   Hepatitis C Screening  Completed   HIV Screening  Completed   HPV VACCINES  Aged Out    Discussed health benefits of physical activity, and encouraged him to engage in regular exercise appropriate for his age and condition.  Problem List Items Addressed This Visit       Cardiovascular and Mediastinum   Primary hypertension    Chronic, stable Continue Hyzaar 100-25 and Metop XL 50 to assist; keep previous doses  Repeat CMP given previous elevated creatinine         Other   Encounter for screening colonoscopy    Previously noted poor prep prior to colon cancer screening; will resend referral to reestablish at this time      Relevant Orders   Ambulatory referral to Gastroenterology   Morbid obesity    Chronic, stable With associated HTN and HLD  Body mass index is 40.99 kg/m. Discussed importance of healthy weight management Discussed diet and exercise       Elevated serum creatinine    Repeat CMP      Relevant Orders   Comprehensive Metabolic Panel (CMET)   Elevated serum glucose    Check A1c given obesity and previous elevated glucose Continue to recommend balanced, lower carb meals. Smaller meal size, adding snacks. Choosing water as drink of choice and increasing purposeful exercise.       Relevant Orders   Hemoglobin A1c    Annual physical exam - Primary    Due for colon cancer screening UTD on dental Denies vision concerns Things to do to keep yourself healthy  - Exercise at least  30-45 minutes a day, 3-4 days a week.  - Eat a low-fat diet with lots of fruits and vegetables, up to 7-9 servings per day.  - Seatbelts can save your life. Wear them always.  - Smoke detectors on every level of your home, check batteries every year.  - Eye Doctor - have an eye exam every 1-2 years  - Safe sex - if you may be exposed to STDs, use a condom.  - Alcohol -  If you drink, do it moderately, less than 2 drinks per day.  - [place]. Choose someone to speak for you if you are not able.  - Depression is common in our stressful world.If you're feeling down or losing interest in things you normally enjoy, please come in for a visit.  - Violence - If anyone is threatening or hurting you, please call immediately.       Return in about 1 year (around 02/24/2024) for annual examination.    I, [place], CMA, have reviewed all documentation for this visit. The documentation on 02/24/23 for the exam, diagnosis, procedures, and orders are all accurate and complete.  [name], [place] [phone] (phone) [phone] (fax)  [place]

## 2023-02-24 NOTE — Assessment & Plan Note (Signed)
Repeat CMP

## 2023-02-24 NOTE — Assessment & Plan Note (Signed)
Due for colon cancer screening UTD on dental Denies vision concerns Things to do to keep yourself healthy  - Exercise at least 30-45 minutes a day, 3-4 days a week.  - Eat a low-fat diet with lots of fruits and vegetables, up to 7-9 servings per day.  - Seatbelts can save your life. Wear them always.  - Smoke detectors on every level of your home, check batteries every year.  - Eye Doctor - have an eye exam every 1-2 years  - Safe sex - if you may be exposed to STDs, use a condom.  - Alcohol -  If you drink, do it moderately, less than 2 drinks per day.  - [place]. Choose someone to speak for you if you are not able.  - Depression is common in our stressful world.If you're feeling down or losing interest in things you normally enjoy, please come in for a visit.  - Violence - If anyone is threatening or hurting you, please call immediately.

## 2023-02-25 LAB — COMPREHENSIVE METABOLIC PANEL
ALT: 57 IU/L — ABNORMAL HIGH (ref 0–44)
AST: 38 IU/L (ref 0–40)
Albumin/Globulin Ratio: 1.8 (ref 1.2–2.2)
Albumin: 4.7 g/dL (ref 3.8–4.9)
Alkaline Phosphatase: 67 IU/L (ref 44–121)
BUN/Creatinine Ratio: 18 (ref 9–20)
BUN: 23 mg/dL (ref 6–24)
Bilirubin Total: 0.4 mg/dL (ref 0.0–1.2)
CO2: 20 mmol/L (ref 20–29)
Calcium: 10.5 mg/dL — ABNORMAL HIGH (ref 8.7–10.2)
Chloride: 102 mmol/L (ref 96–106)
Creatinine, Ser: 1.29 mg/dL — ABNORMAL HIGH (ref 0.76–1.27)
Globulin, Total: 2.6 g/dL (ref 1.5–4.5)
Glucose: 136 mg/dL — ABNORMAL HIGH (ref 70–99)
Potassium: 4.4 mmol/L (ref 3.5–5.2)
Sodium: 137 mmol/L (ref 134–144)
Total Protein: 7.3 g/dL (ref 6.0–8.5)
eGFR: 67 mL/min/{1.73_m2} (ref >59)

## 2023-02-25 LAB — HEMOGLOBIN A1C
Est. average glucose Bld gHb Est-mCnc: 111 mg/dL
Hgb A1c MFr Bld: 5.5 % (ref 4.8–5.6)

## 2023-02-25 NOTE — Progress Notes (Signed)
Blood chemistry relatively stable. Slight increase in ALT, liver enzyme. Continue to monitor water intake with goal of 48 oz/day, avoid excess NSAIDs and control alcohol intake to 1-2 drinks/day if applicable.

## 2023-03-04 ENCOUNTER — Encounter: Payer: Self-pay | Admitting: *Deleted

## 2023-08-26 ENCOUNTER — Ambulatory Visit (INDEPENDENT_AMBULATORY_CARE_PROVIDER_SITE_OTHER): Payer: BC Managed Care – PPO | Admitting: Family Medicine

## 2023-08-26 DIAGNOSIS — Z91199 Patient's noncompliance with other medical treatment and regimen due to unspecified reason: Secondary | ICD-10-CM

## 2023-08-26 NOTE — Progress Notes (Signed)
Patient was not seen for appt d/t no call, no show, or late arrival >10 mins past appt time.   Elise T Payne, FNP  [facility] [address] [place], [place] [address] [phone] (phone) [phone] (fax) [facility]  

## 2023-08-27 ENCOUNTER — Encounter: Payer: Self-pay | Admitting: Family Medicine

## 2023-08-27 ENCOUNTER — Ambulatory Visit: Payer: BC Managed Care – PPO | Admitting: Family Medicine

## 2023-08-27 VITALS — BP 122/84 | HR 84 | Temp 98.5°F | Ht 65.0 in | Wt 239.0 lb

## 2023-08-27 DIAGNOSIS — E7800 Pure hypercholesterolemia, unspecified: Secondary | ICD-10-CM | POA: Diagnosis not present

## 2023-08-27 DIAGNOSIS — I10 Essential (primary) hypertension: Secondary | ICD-10-CM | POA: Diagnosis not present

## 2023-08-27 DIAGNOSIS — R7989 Other specified abnormal findings of blood chemistry: Secondary | ICD-10-CM

## 2023-08-27 DIAGNOSIS — R739 Hyperglycemia, unspecified: Secondary | ICD-10-CM | POA: Diagnosis not present

## 2023-08-27 NOTE — Progress Notes (Signed)
Erroneous

## 2023-08-27 NOTE — Progress Notes (Signed)
Established patient visit   Patient: Shane Hayes   DOB: 1971-02-13   52 y.o. Male  MRN: [id number] Visit Date: 08/27/2023  Today's healthcare provider: Jacky Kindle, FNP  Introduced to nurse practitioner role and practice setting.  All questions answered.  Discussed provider/patient relationship and expectations.  Subjective    Hypertension   HPI     Hypertension    Additional comments: Patient was last seen in January.  He was given prescriptions for Losartan hydrochlorothiazide and Metoprolol 90 supply of each with refills.  After his first prescription he said he did not realize he had refills and has not been on the medication in months.        Last edited by Adline Peals, CMA on 08/27/2023  1:04 PM.      The patient, with a history of hypertension, hyperlipidemia, and tobacco use, presents after 6 months where he notes he has been off his medications for a few months, but reports no adverse effects due to not picking up additional medication refills. He has experienced a slight increase in blood pressure, but overall, he feels well. He reports a recent cold, but has otherwise been maintaining his health. He has lost about seven pounds since April, which may have contributed to the stabilization of his blood pressure without medication into the Stage ! Hypertension class. He denies any symptoms of rebound tachycardia after discontinuing metoprolol. He also reports some stress related to family matters, but is managing as best he can.  Medications: Outpatient Medications Prior to Visit  Medication Sig   losartan-hydrochlorothiazide (HYZAAR) 100-25 MG tablet Take 1 tablet by mouth daily. (Patient not taking: Reported on 08/27/2023)   metoprolol succinate (TOPROL-XL) 50 MG 24 hr tablet Take 1 tablet (50 mg total) by mouth daily. (Patient not taking: Reported on 08/27/2023)   Multiple Vitamin (MULTIVITAMIN) tablet Take 1 tablet by mouth daily. (Patient not taking: Reported on  08/27/2023)   No facility-administered medications prior to visit.     Objective    BP 122/84   Pulse 84   Temp 98.5 F (36.9 C) (Oral)   Ht 5\' 5"  (1.651 m)   Wt 239 lb (108.4 kg)   SpO2 95%   BMI 39.77 kg/m   Physical Exam Vitals and nursing note reviewed.  Constitutional:      Appearance: Normal appearance. He is obese.  HENT:     Head: Normocephalic and atraumatic.  Cardiovascular:     Rate and Rhythm: Normal rate and regular rhythm.     Pulses: Normal pulses.     Heart sounds: Normal heart sounds.  Pulmonary:     Effort: Pulmonary effort is normal.     Breath sounds: Normal breath sounds.  Musculoskeletal:        General: Normal range of motion.     Cervical back: Normal range of motion.  Skin:    General: Skin is warm and dry.     Capillary Refill: Capillary refill takes less than 2 seconds.  Neurological:     General: No focal deficit present.     Mental Status: He is alert and oriented to person, place, and time. Mental status is at baseline.  Psychiatric:        Mood and Affect: Mood normal.        Behavior: Behavior normal.        Thought Content: Thought content normal.        Judgment: Judgment normal.  No results found for any visits on 08/27/23.  Assessment & Plan    Hypertension Blood pressure slightly elevated today (122/84) after discontinuation of Metoprolol XL 50 mg and Hyzaar 100-25 for a few months. No reported side effects from discontinuation. -Order labs to assess kidney function and other parameters. -Encourage adherence to DASH diet and regular exercise. -Reevaluate need for medication after lab results.  Hyperlipidemia Previously controlled with diet changes. -Continue dietary management.  Prediabetes Previous elevated glucose, but no diagnosis of prediabetes or diabetes. -Continue monitoring with regular labs.  Tobacco Use History of tobacco use, currently not smoking. -Continue abstinence from tobacco.  General Health  Maintenance -PSA up to date, pt denies any current concerns with bladder or prostate. -Colonoscopy up to date, due 2032 -Eligible for shingles vaccine, patient decision pending. -Continue monitoring weight, BMI still in obesity range. Body mass index is 39.77 kg/m.  -Check labs today and communicate results via MyChart regarding creatinine and need for medication restart.   Leilani Merl, FNP, have reviewed all documentation for this visit. The documentation on 08/27/23 for the exam, diagnosis, procedures, and orders are all accurate and complete.  Jacky Kindle, FNP  [facility] [phone] (phone) [phone] (fax)  [facility]

## 2023-08-28 LAB — CBC WITH DIFFERENTIAL/PLATELET
Basophils Absolute: 0.1 10*3/uL (ref 0.0–0.2)
Basos: 1 %
EOS (ABSOLUTE): 0.2 10*3/uL (ref 0.0–0.4)
Eos: 2 %
Hematocrit: 50.4 % (ref 37.5–51.0)
Hemoglobin: 16.1 g/dL (ref 13.0–17.7)
Immature Grans (Abs): 0 10*3/uL (ref 0.0–0.1)
Immature Granulocytes: 0 %
Lymphocytes Absolute: 1.9 10*3/uL (ref 0.7–3.1)
Lymphs: 23 %
MCH: 30 pg (ref 26.6–33.0)
MCHC: 31.9 g/dL (ref 31.5–35.7)
MCV: 94 fL (ref 79–97)
Monocytes Absolute: 0.7 10*3/uL (ref 0.1–0.9)
Monocytes: 9 %
Neutrophils Absolute: 5.3 10*3/uL (ref 1.4–7.0)
Neutrophils: 65 %
Platelets: 285 10*3/uL (ref 150–450)
RBC: 5.37 x10E6/uL (ref 4.14–5.80)
RDW: 12.6 % (ref 11.6–15.4)
WBC: 8.1 10*3/uL (ref 3.4–10.8)

## 2023-08-28 LAB — LIPID PANEL
Chol/HDL Ratio: 3.8 {ratio} (ref 0.0–5.0)
Cholesterol, Total: 165 mg/dL (ref 100–199)
HDL: 43 mg/dL (ref >39)
LDL Chol Calc (NIH): 106 mg/dL — ABNORMAL HIGH (ref 0–99)
Triglycerides: 84 mg/dL (ref 0–149)
VLDL Cholesterol Cal: 16 mg/dL (ref 5–40)

## 2023-08-28 LAB — COMPREHENSIVE METABOLIC PANEL
ALT: 39 [IU]/L (ref 0–44)
AST: 29 [IU]/L (ref 0–40)
Albumin: 4.5 g/dL (ref 3.8–4.9)
Alkaline Phosphatase: 76 [IU]/L (ref 44–121)
BUN/Creatinine Ratio: 9 (ref 9–20)
BUN: 11 mg/dL (ref 6–24)
Bilirubin Total: 0.5 mg/dL (ref 0.0–1.2)
CO2: 24 mmol/L (ref 20–29)
Calcium: 10.4 mg/dL — ABNORMAL HIGH (ref 8.7–10.2)
Chloride: 104 mmol/L (ref 96–106)
Creatinine, Ser: 1.27 mg/dL (ref 0.76–1.27)
Globulin, Total: 2.5 g/dL (ref 1.5–4.5)
Glucose: 102 mg/dL — ABNORMAL HIGH (ref 70–99)
Potassium: 4.6 mmol/L (ref 3.5–5.2)
Sodium: 140 mmol/L (ref 134–144)
Total Protein: 7 g/dL (ref 6.0–8.5)
eGFR: 68 mL/min/{1.73_m2} (ref >59)

## 2023-08-28 LAB — TSH: TSH: 0.694 u[IU]/mL (ref 0.450–4.500)

## 2023-08-28 LAB — HEMOGLOBIN A1C
Est. average glucose Bld gHb Est-mCnc: 103 mg/dL
Hgb A1c MFr Bld: 5.2 % (ref 4.8–5.6)

## 2023-08-28 NOTE — Progress Notes (Signed)
The 10-year ASCVD risk score (Arnett DK, et al., 2019) is: 9.1%  Cholesterol has increased. Risk of heart attack/stroke is now 9% in 10 years. I continue to recommend diet low in saturated fat and regular exercise - 30 min at least 5 times per week. If not improved in 6 months, I would recommend statin to assist with risk reduction.  All other labs are normal and stable.  Can continue to stay off blood pressure meds with stable lifestyle changes.

## 2023-09-03 ENCOUNTER — Telehealth: Payer: Self-pay

## 2023-09-03 NOTE — Telephone Encounter (Signed)
-----   Message from Jacky Kindle sent at 08/28/2023  8:14 AM EDT ----- The 10-year ASCVD risk score (Arnett DK, et al., 2019) is: 9.1%  Cholesterol has increased. Risk of heart attack/stroke is now 9% in 10 years. I continue to recommend diet low in saturated fat and regular exercise - 30 min at least 5 times per week. If not improved in 6 months, I would recommend statin to assist with risk reduction.  All other labs are normal and stable.  Can continue to stay off blood pressure meds with stable lifestyle changes.

## 2023-11-26 ENCOUNTER — Ambulatory Visit: Payer: BC Managed Care – PPO | Admitting: Family Medicine

## 2023-12-08 ENCOUNTER — Ambulatory Visit: Payer: BC Managed Care – PPO | Admitting: Physician Assistant

## 2023-12-08 VITALS — BP 134/88 | HR 90 | Ht 65.0 in | Wt 242.0 lb

## 2023-12-08 DIAGNOSIS — F439 Reaction to severe stress, unspecified: Secondary | ICD-10-CM

## 2023-12-08 DIAGNOSIS — E7800 Pure hypercholesterolemia, unspecified: Secondary | ICD-10-CM | POA: Diagnosis not present

## 2023-12-08 DIAGNOSIS — I10 Essential (primary) hypertension: Secondary | ICD-10-CM

## 2023-12-08 DIAGNOSIS — F17200 Nicotine dependence, unspecified, uncomplicated: Secondary | ICD-10-CM

## 2023-12-08 DIAGNOSIS — R739 Hyperglycemia, unspecified: Secondary | ICD-10-CM | POA: Diagnosis not present

## 2023-12-08 DIAGNOSIS — M79644 Pain in right finger(s): Secondary | ICD-10-CM | POA: Diagnosis not present

## 2023-12-08 DIAGNOSIS — R7989 Other specified abnormal findings of blood chemistry: Secondary | ICD-10-CM

## 2023-12-08 NOTE — Progress Notes (Signed)
 Established patient visit  Patient: Shane Hayes   DOB: [birth year]   53 y.o. Male  MRN: [id number] Visit Date: 12/08/2023  Today's healthcare provider: Jolynn Spencer, PA-C   Chief Complaint  Patient presents with   Follow-up    HTN   Subjective       Comments   Pt stated--thumb problem N-numbness, sharp pain L-right tip--thumb D-1-2 month O-comes and go C-better today A-grabbing or picking-up T-brace       Discussed the use of AI scribe software for clinical note transcription with the patient, who gave verbal consent to proceed.  History of Present Illness   The patient, a single parent working two jobs, presents with concerns about high blood pressure. He reports trying to manage his blood pressure through dietary changes and plans to incorporate exercise into his routine. He does not currently take any medication for blood pressure, as he was previously told it was not necessary. However, he acknowledges that his blood pressure was slightly elevated during the current visit. 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 The pain is severe enough to prevent him from applying pressure with the hand and is associated with a tingling sensation. The patient has had carpal tunnel surgery on the other hand in the past.     The 10-year ASCVD risk score (Arnett DK, et al., 2019) is: 11.2%       08/27/2023    1:07 PM 02/24/2023    8:54 AM 12/16/2022    9:08 AM  Depression screen PHQ 2/9  Decreased Interest 0 0 0  Down, Depressed, Hopeless 0 0 0  PHQ - 2 Score 0 0 0  Altered sleeping 0  0  Tired, decreased energy 1  1  Change in appetite 0  0  Feeling bad or failure about yourself  0  0  Trouble concentrating 0  0  Moving slowly or fidgety/restless 0  0  Suicidal thoughts 0  0  PHQ-9 Score 1  1  Difficult doing work/chores Not difficult at all  Not difficult at all      08/27/2023    1:07 PM  GAD 7  : Generalized Anxiety Score  Nervous, Anxious, on Edge 0  Control/stop worrying 0  Worry too much - different things 0  Trouble relaxing 0  Restless 0  Easily annoyed or irritable 0  Afraid - awful might happen 0  Total GAD 7 Score 0  Anxiety Difficulty Not difficult at all    Medications: Outpatient Medications Prior to Visit  Medication Sig   Multiple Vitamin (MULTIVITAMIN) tablet Take 1 tablet by mouth daily.   losartan -hydrochlorothiazide  (HYZAAR) 100-25 MG tablet Take 1 tablet by mouth daily. (Patient not taking: Reported on 12/08/2023)   metoprolol  succinate (TOPROL -XL) 50 MG 24 hr tablet Take 1 tablet (50 mg total) by mouth daily. (Patient not taking: Reported on 12/08/2023)   No facility-administered medications prior to visit.    Review of Systems All negative Except see HPI       Objective    BP 134/88 Comment: manual  Pulse 90   Ht 5' 5 (1.651 m)   Wt 242 lb (109.8 kg)   SpO2 97%   BMI 40.27 kg/m     Physical Exam Vitals reviewed.  Constitutional:      General: He is not in acute distress.    Appearance: Normal appearance. He is obese. He is not diaphoretic.  HENT:     Head: Normocephalic and atraumatic.  Eyes:     General: No scleral icterus.    Conjunctiva/sclera: Conjunctivae normal.  Cardiovascular:     Rate and Rhythm: Normal rate and regular rhythm.     Pulses: Normal pulses.     Heart sounds: Normal heart sounds. No murmur heard. Pulmonary:     Effort: Pulmonary effort is normal. No respiratory distress.     Breath sounds: Normal breath sounds. No wheezing or rhonchi.  Musculoskeletal:     Cervical back: Neck supple.     Right lower leg: No edema.     Left lower leg: No edema.  Lymphadenopathy:     Cervical: No cervical adenopathy.  Skin:    General: Skin is warm and dry.     Findings: No rash.  Neurological:     Mental Status: He is alert and oriented to person, place, and time. Mental status is at baseline.  Psychiatric:         Mood and Affect: Mood normal.        Behavior: Behavior normal.      Results for orders placed or performed in visit on 12/08/23  Lipid panel  Result Value Ref Range   Cholesterol, Total 171 100 - 199 mg/dL   Triglycerides 865 0 - 149 mg/dL   HDL 39 (L) >60 mg/dL   VLDL Cholesterol Cal 24 5 - 40 mg/dL   LDL Chol Calc (NIH) 891 (H) 0 - 99 mg/dL   Chol/HDL Ratio 4.4 0.0 - 5.0 ratio  Comprehensive metabolic panel  Result Value Ref Range   Glucose 102 (H) 70 - 99 mg/dL   BUN 12 6 - 24 mg/dL   Creatinine, Ser 8.82 0.76 - 1.27 mg/dL   eGFR 75 >40 fO/fpw/8.26   BUN/Creatinine Ratio 10 9 - 20   Sodium 142 134 - 144 mmol/L   Potassium 4.4 3.5 - 5.2 mmol/L   Chloride 106 96 - 106 mmol/L   CO2 22 20 - 29 mmol/L   Calcium  10.6 (H) 8.7 - 10.2 mg/dL   Total Protein 7.1 6.0 - 8.5 g/dL   Albumin 4.5 3.8 - 4.9 g/dL   Globulin, Total 2.6 1.5 - 4.5 g/dL   Bilirubin Total 0.4 0.0 - 1.2 mg/dL   Alkaline Phosphatase 74 44 - 121 IU/L   AST 31 0 - 40 IU/L   ALT 53 (H) 0 - 44 IU/L  Hemoglobin A1c  Result Value Ref Range   Hgb A1c MFr Bld 5.3 4.8 - 5.6 %   Est. average glucose Bld gHb Est-mCnc 105 mg/dL        Assessment and Plan 1. Primary hypertension (Primary) chronic Elevated blood pressure noted during visit. Patient is not currently on medication and does not regularly monitor blood pressure at home. Discussed the importance of regular monitoring and potential need for medication. -Advise patient to monitor blood pressure daily at home. -Consider starting antihypertensive medication if blood pressure remains elevated. In the past, took hyzaar 100-25 and metoprolol  50 -Return visit in one month to reassess blood pressure control. The 10-year ASCVD risk score (Arnett DK, et al., 2019) is: 11.2% - Lipid panel - will update - Comprehensive metabolic panel Will reassess after receiving lab results  Morbid obesity (HCC) Chronic Body mass index is 40.27 kg/m. Weight  Management Patient's weight is stable but elevated. Patient expressed intent to start exercising. -Encourage adherence to a low-salt diet and regular exercise. -Continue to monitor weight at subsequent visits. -  Lipid panel - Comprehensive metabolic panel Will reassess after  receiving lab results  Elevated serum creatinine cmp Elevated LDL cholesterol level Lp ordered - Comprehensive metabolic panel Elevated serum glucose -A1c, cmp  Smoking Cigars chronic Patient reports smoking 3-4 cigars daily. -Encourage smoking cessation and provide resources for quitting.  Pain in thumb, right Hand Pain Patient reports pain in hand, particularly with flexion. History of carpal tunnel surgery on the other hand. -Refer to [facility] for further evaluation and potential imaging. -Advise patient to continue wearing brace and to use heat and cold therapy, take otc pain management  General Health Maintenance -Order labs including metabolic panel, lipid panel, and A1C. -Advise patient to complete labs today.   Orders Placed This Encounter  Procedures   Lipid panel    Has the patient fasted?:   Yes   Comprehensive metabolic panel    Has the patient fasted?:   Yes   Hemoglobin A1c   Hemoglobin A1c    Return in about 4 weeks (around 01/05/2024) for BP f/u.   The patient was advised to call back or seek an in-person evaluation if the symptoms worsen or if the condition fails to improve as anticipated.  I discussed the assessment and treatment plan with the patient. The patient was provided an opportunity to ask questions and all were answered. The patient agreed with the plan and demonstrated an understanding of the instructions.  I, Marvin Grabill, PA-C have reviewed all documentation for this visit. The documentation on 12/08/2023  for the exam, diagnosis, procedures, and orders are all accurate and complete.  Jolynn Spencer, [facility], MMS [facility] [phone]  (phone) [phone] (fax)  [facility]

## 2023-12-09 ENCOUNTER — Encounter: Payer: Self-pay | Admitting: Physician Assistant

## 2023-12-09 DIAGNOSIS — F17200 Nicotine dependence, unspecified, uncomplicated: Secondary | ICD-10-CM | POA: Insufficient documentation

## 2023-12-09 LAB — COMPREHENSIVE METABOLIC PANEL
ALT: 53 [IU]/L — ABNORMAL HIGH (ref 0–44)
AST: 31 [IU]/L (ref 0–40)
Albumin: 4.5 g/dL (ref 3.8–4.9)
Alkaline Phosphatase: 74 [IU]/L (ref 44–121)
BUN/Creatinine Ratio: 10 (ref 9–20)
BUN: 12 mg/dL (ref 6–24)
Bilirubin Total: 0.4 mg/dL (ref 0.0–1.2)
CO2: 22 mmol/L (ref 20–29)
Calcium: 10.6 mg/dL — ABNORMAL HIGH (ref 8.7–10.2)
Chloride: 106 mmol/L (ref 96–106)
Creatinine, Ser: 1.17 mg/dL (ref 0.76–1.27)
Globulin, Total: 2.6 g/dL (ref 1.5–4.5)
Glucose: 102 mg/dL — ABNORMAL HIGH (ref 70–99)
Potassium: 4.4 mmol/L (ref 3.5–5.2)
Sodium: 142 mmol/L (ref 134–144)
Total Protein: 7.1 g/dL (ref 6.0–8.5)
eGFR: 75 mL/min/{1.73_m2} (ref >59)

## 2023-12-09 LAB — LIPID PANEL
Chol/HDL Ratio: 4.4 {ratio} (ref 0.0–5.0)
Cholesterol, Total: 171 mg/dL (ref 100–199)
HDL: 39 mg/dL — ABNORMAL LOW (ref >39)
LDL Chol Calc (NIH): 108 mg/dL — ABNORMAL HIGH (ref 0–99)
Triglycerides: 134 mg/dL (ref 0–149)
VLDL Cholesterol Cal: 24 mg/dL (ref 5–40)

## 2023-12-09 LAB — HEMOGLOBIN A1C
Est. average glucose Bld gHb Est-mCnc: 105 mg/dL
Hgb A1c MFr Bld: 5.3 % (ref 4.8–5.6)

## 2023-12-29 DIAGNOSIS — S63601A Unspecified sprain of right thumb, initial encounter: Secondary | ICD-10-CM | POA: Diagnosis not present

## 2024-01-03 NOTE — Progress Notes (Unsigned)
Established patient visit  Patient: Shane Hayes   DOB: 1971-07-24   53 y.o. Male  MRN: [id number] Visit Date: 01/05/2024  Today's healthcare provider: Debera Lat, PA-C   No chief complaint on file.  Subjective       Discussed the use of AI scribe software for clinical note transcription with the patient, who gave verbal consent to proceed.  History of Present Illness           The 10-year ASCVD risk score (Arnett DK, et al., 2019) is: 7.7%     08/27/2023    1:07 PM 02/24/2023    8:54 AM 12/16/2022    9:08 AM  Depression screen PHQ 2/9  Decreased Interest 0 0 0  Down, Depressed, Hopeless 0 0 0  PHQ - 2 Score 0 0 0  Altered sleeping 0  0  Tired, decreased energy 1  1  Change in appetite 0  0  Feeling bad or failure about yourself  0  0  Trouble concentrating 0  0  Moving slowly or fidgety/restless 0  0  Suicidal thoughts 0  0  PHQ-9 Score 1  1  Difficult doing work/chores Not difficult at all  Not difficult at all      08/27/2023    1:07 PM  GAD 7 : Generalized Anxiety Score  Nervous, Anxious, on Edge 0  Control/stop worrying 0  Worry too much - different things 0  Trouble relaxing 0  Restless 0  Easily annoyed or irritable 0  Afraid - awful might happen 0  Total GAD 7 Score 0  Anxiety Difficulty Not difficult at all    Medications: Outpatient Medications Prior to Visit  Medication Sig   Multiple Vitamin (MULTIVITAMIN) tablet Take 1 tablet by mouth daily.   No facility-administered medications prior to visit.    Review of Systems  All other systems reviewed and are negative. All negative Except see HPI   {Insert previous labs (optional):23779} {See past labs  Heme  Chem  Endocrine  Serology  Results Review (optional):1}   Objective    There were no vitals taken for this visit. {Insert last BP/Wt (optional):23777}{See vitals history (optional):1}   Physical Exam Vitals reviewed.  Constitutional:      General: He is not in acute  distress.    Appearance: Normal appearance. He is not diaphoretic.  HENT:     Head: Normocephalic and atraumatic.  Eyes:     General: No scleral icterus.    Conjunctiva/sclera: Conjunctivae normal.  Cardiovascular:     Rate and Rhythm: Normal rate and regular rhythm.     Pulses: Normal pulses.     Heart sounds: Normal heart sounds. No murmur heard. Pulmonary:     Effort: Pulmonary effort is normal. No respiratory distress.     Breath sounds: Normal breath sounds. No wheezing or rhonchi.  Musculoskeletal:     Cervical back: Neck supple.     Right lower leg: No edema.     Left lower leg: No edema.  Lymphadenopathy:     Cervical: No cervical adenopathy.  Skin:    General: Skin is warm and dry.     Findings: No rash.  Neurological:     Mental Status: He is alert and oriented to person, place, and time. Mental status is at baseline.  Psychiatric:        Mood and Affect: Mood normal.        Behavior: Behavior normal.     No results found for any  visits on 01/05/24.      Assessment and Plan             No orders of the defined types were placed in this encounter.   No follow-ups on file.   The patient was advised to call back or seek an in-person evaluation if the symptoms worsen or if the condition fails to improve as anticipated.  I discussed the assessment and treatment plan with the patient. The patient was provided an opportunity to ask questions and all were answered. The patient agreed with the plan and demonstrated an understanding of the instructions.  I, Debera Lat, PA-C have reviewed all documentation for this visit. The documentation on 01/05/2024  for the exam, diagnosis, procedures, and orders are all accurate and complete.  Debera Lat, [facility], MMS [facility] [phone] (phone) [phone] (fax)  [facility]

## 2024-01-05 ENCOUNTER — Ambulatory Visit
Admission: RE | Admit: 2024-01-05 | Discharge: 2024-01-05 | Disposition: A | Discharge status: Home / Self Care | Payer: BC Managed Care – PPO | Attending: Physician Assistant | Admitting: Physician Assistant

## 2024-01-05 ENCOUNTER — Encounter: Payer: Self-pay | Admitting: Physician Assistant

## 2024-01-05 ENCOUNTER — Ambulatory Visit: Payer: Self-pay | Admitting: Physician Assistant

## 2024-01-05 ENCOUNTER — Ambulatory Visit
Admission: RE | Admit: 2024-01-05 | Discharge: 2024-01-05 | Disposition: A | Discharge status: Home / Self Care | Payer: BC Managed Care – PPO | Source: Ambulatory Visit | Attending: Physician Assistant | Admitting: Physician Assistant

## 2024-01-05 VITALS — BP 144/92 | HR 101 | Resp 18 | Ht 65.0 in | Wt 238.8 lb

## 2024-01-05 DIAGNOSIS — R0981 Nasal congestion: Secondary | ICD-10-CM | POA: Insufficient documentation

## 2024-01-05 DIAGNOSIS — I10 Essential (primary) hypertension: Secondary | ICD-10-CM | POA: Diagnosis not present

## 2024-01-05 DIAGNOSIS — E669 Obesity, unspecified: Secondary | ICD-10-CM | POA: Diagnosis not present

## 2024-01-05 DIAGNOSIS — R739 Hyperglycemia, unspecified: Secondary | ICD-10-CM

## 2024-01-05 DIAGNOSIS — E7800 Pure hypercholesterolemia, unspecified: Secondary | ICD-10-CM

## 2024-01-05 DIAGNOSIS — F17200 Nicotine dependence, unspecified, uncomplicated: Secondary | ICD-10-CM

## 2024-01-05 DIAGNOSIS — E7849 Other hyperlipidemia: Secondary | ICD-10-CM

## 2024-01-05 DIAGNOSIS — R058 Other specified cough: Secondary | ICD-10-CM

## 2024-01-05 DIAGNOSIS — R059 Cough, unspecified: Secondary | ICD-10-CM | POA: Diagnosis not present

## 2024-01-05 DIAGNOSIS — R0989 Other specified symptoms and signs involving the circulatory and respiratory systems: Secondary | ICD-10-CM

## 2024-01-05 MED ORDER — ROSUVASTATIN CALCIUM 5 MG PO TABS
5.0000 mg | ORAL_TABLET | Freq: Every day | ORAL | 3 refills | Status: AC
Start: 2024-01-05 — End: ?

## 2024-01-05 MED ORDER — LOSARTAN POTASSIUM-HCTZ 50-12.5 MG PO TABS
1.0000 | ORAL_TABLET | Freq: Every day | ORAL | 3 refills | Status: AC
Start: 2024-01-05 — End: ?

## 2024-01-05 MED ORDER — FLUTICASONE PROPIONATE 50 MCG/ACT NA SUSP: 2.0000 | Freq: Every day | NASAL | 6 refills | Status: AC

## 2024-01-06 ENCOUNTER — Encounter: Payer: Self-pay | Admitting: Physician Assistant

## 2024-02-02 ENCOUNTER — Ambulatory Visit: Payer: BC Managed Care – PPO | Admitting: Physician Assistant

## 2024-02-02 ENCOUNTER — Encounter: Payer: Self-pay | Admitting: Physician Assistant

## 2024-02-02 VITALS — BP 125/76 | HR 99 | Temp 98.1°F | Ht 66.0 in | Wt 241.0 lb

## 2024-02-02 DIAGNOSIS — E7849 Other hyperlipidemia: Secondary | ICD-10-CM | POA: Diagnosis not present

## 2024-02-02 DIAGNOSIS — I10 Essential (primary) hypertension: Secondary | ICD-10-CM | POA: Diagnosis not present

## 2024-02-02 DIAGNOSIS — E669 Obesity, unspecified: Secondary | ICD-10-CM | POA: Diagnosis not present

## 2024-02-02 DIAGNOSIS — F17200 Nicotine dependence, unspecified, uncomplicated: Secondary | ICD-10-CM

## 2024-02-02 DIAGNOSIS — Z6838 Body mass index (BMI) 38.0-38.9, adult: Secondary | ICD-10-CM

## 2024-02-02 DIAGNOSIS — R0981 Nasal congestion: Secondary | ICD-10-CM

## 2024-02-02 NOTE — Progress Notes (Unsigned)
 Established patient visit  Patient: Shane Hayes   DOB: 17-Aug-1971   53 y.o. Male  MRN: [id number] Visit Date: 02/02/2024  Today's healthcare provider: Debera Lat, PA-C   Chief Complaint  Patient presents with   Hypertension    Bp f/u   Subjective     HPI     Hypertension    Additional comments: Bp f/u      Last edited by Liz Beach, CMA on 02/02/2024  9:54 AM.       Discussed the use of AI scribe software for clinical note transcription with the patient, who gave verbal consent to proceed.  History of Present Illness   The patient, on medication for an unspecified condition, reports no changes since the last visit a month ago. He acknowledges the need to improve his diet and increase physical activity. The patient admits to struggling with dietary changes and physical activity, citing fluctuating eating habits due to weather conditions. He denies any muscle pain from the medication Crestor. The patient also reports a need to quit smoking, with a goal to quit by summer. He has a history of quitting for three years but relapsed due to personal stressors. The patient denies any symptoms of chest pain, shortness of breath, rapid heart beating, or leg swelling. He also denies any symptoms of depression or anxiety.          02/02/2024   10:40 AM 01/05/2024    9:15 AM 08/27/2023    1:07 PM  PHQ9 SCORE ONLY  PHQ-9 Total Score 0 0 1       02/02/2024   10:40 AM 01/05/2024    9:15 AM 08/27/2023    1:07 PM  Depression screen PHQ 2/9  Decreased Interest 0 0 0  Down, Depressed, Hopeless 0 0 0  PHQ - 2 Score 0 0 0  Altered sleeping 0 0 0  Tired, decreased energy 0 0 1  Change in appetite 0 0 0  Feeling bad or failure about yourself  0 0 0  Trouble concentrating 0 0 0  Moving slowly or fidgety/restless 0 0 0  Suicidal thoughts 0 0 0  PHQ-9 Score 0 0 1  Difficult doing work/chores Not difficult at all  Not difficult at all      02/02/2024   10:40 AM 08/27/2023     1:07 PM  GAD 7 : Generalized Anxiety Score  Nervous, Anxious, on Edge 0 0  Control/stop worrying 0 0  Worry too much - different things 0 0  Trouble relaxing 0 0  Restless 0 0  Easily annoyed or irritable  0  Afraid - awful might happen 0 0  Total GAD 7 Score  0  Anxiety Difficulty  Not difficult at all    Medications: Outpatient Medications Prior to Visit  Medication Sig   fluticasone (FLONASE) 50 MCG/ACT nasal spray Place 2 sprays into both nostrils daily.   losartan-hydrochlorothiazide (HYZAAR) 50-12.5 MG tablet Take 1 tablet by mouth daily.   Multiple Vitamin (MULTIVITAMIN) tablet Take 1 tablet by mouth daily.   rosuvastatin (CRESTOR) 5 MG tablet Take 1 tablet (5 mg total) by mouth daily.   No facility-administered medications prior to visit.    Review of Systems  All other systems reviewed and are negative.  All negative Except see HPI   {Insert previous labs (optional):23779} {See past labs  Heme  Chem  Endocrine  Serology  Results Review (optional):1}   Objective    BP 125/76 (BP Location: Left Arm,  Patient Position: Sitting, Cuff Size: Large)   Pulse 99   Temp 98.1 F (36.7 C) (Oral)   Ht 5\' 6"  (1.676 m)   Wt 241 lb (109.3 kg)   SpO2 100%   BMI 38.90 kg/m  {Insert last BP/Wt (optional):23777}{See vitals history (optional):1}   Physical Exam Vitals reviewed.  Constitutional:      General: He is not in acute distress.    Appearance: Normal appearance. He is not diaphoretic.  HENT:     Head: Normocephalic and atraumatic.  Eyes:     General: No scleral icterus.    Conjunctiva/sclera: Conjunctivae normal.  Cardiovascular:     Rate and Rhythm: Normal rate and regular rhythm.     Pulses: Normal pulses.     Heart sounds: Normal heart sounds. No murmur heard. Pulmonary:     Effort: Pulmonary effort is normal. No respiratory distress.     Breath sounds: Normal breath sounds. No wheezing or rhonchi.  Musculoskeletal:     Cervical back: Neck supple.      Right lower leg: No edema.     Left lower leg: No edema.  Lymphadenopathy:     Cervical: No cervical adenopathy.  Skin:    General: Skin is warm and dry.     Findings: No rash.  Neurological:     Mental Status: He is alert and oriented to person, place, and time. Mental status is at baseline.  Psychiatric:        Mood and Affect: Mood normal.        Behavior: Behavior normal.      No results found for any visits on 02/02/24.      Assessment and Plan    Hyperlipidemia On Crestor for two months. Previous cholesterol levels suboptimal. Emphasized diet and exercise importance. Encouraged 5% weight loss over 3-6 months. - Order lipid panel. - Provide cholesterol-lowering diet plan. - Recommend exercise 150 minutes per week.  Hypertension Blood pressure well-controlled on Hyzaar. Emphasized lifestyle modifications for further improvement. - Continue Hyzaar. - Recommend dietary changes for blood pressure control. - Advise exercise 150 minutes per week.  Smoking Cessation Encouraged continued smoking cessation efforts. Discussed benefits of activities like martial arts. - Encourage smoking cessation efforts. - Reassess smoking status in three months. - Suggest engaging in activities like martial arts.        Orders Placed This Encounter  Procedures   Lipid panel    Has the patient fasted?:   Yes    Return in about 3 months (around 05/04/2024) for chronic disease f/u, BP f/u.   The patient was advised to call back or seek an in-person evaluation if the symptoms worsen or if the condition fails to improve as anticipated.  I discussed the assessment and treatment plan with the patient. The patient was provided an opportunity to ask questions and all were answered. The patient agreed with the plan and demonstrated an understanding of the instructions.  I, Debera Lat, PA-C have reviewed all documentation for this visit. The documentation on 02/02/2024  for the exam,  diagnosis, procedures, and orders are all accurate and complete.  Debera Lat, [facility], MMS [facility] [phone] (phone) [phone] (fax)  [facility]

## 2024-02-03 DIAGNOSIS — E669 Obesity, unspecified: Secondary | ICD-10-CM | POA: Insufficient documentation

## 2024-02-03 DIAGNOSIS — E7849 Other hyperlipidemia: Secondary | ICD-10-CM | POA: Insufficient documentation

## 2024-02-03 LAB — LIPID PANEL
Chol/HDL Ratio: 2.9 ratio (ref 0.0–5.0)
Cholesterol, Total: 127 mg/dL (ref 100–199)
HDL: 44 mg/dL (ref >39)
LDL Chol Calc (NIH): 69 mg/dL (ref 0–99)
Triglycerides: 69 mg/dL (ref 0–149)
VLDL Cholesterol Cal: 14 mg/dL (ref 5–40)

## 2024-02-05 ENCOUNTER — Encounter: Payer: Self-pay | Admitting: Physician Assistant

## 2024-02-23 ENCOUNTER — Encounter: Payer: BC Managed Care – PPO | Admitting: Family Medicine

## 2024-05-03 ENCOUNTER — Ambulatory Visit: Admitting: Physician Assistant

## 2024-05-03 ENCOUNTER — Encounter: Payer: Self-pay | Admitting: Physician Assistant

## 2024-05-03 VITALS — BP 115/80 | HR 74 | Resp 16 | Ht 66.0 in | Wt 242.0 lb

## 2024-05-03 DIAGNOSIS — I10 Essential (primary) hypertension: Secondary | ICD-10-CM

## 2024-05-03 DIAGNOSIS — E7849 Other hyperlipidemia: Secondary | ICD-10-CM

## 2024-05-03 DIAGNOSIS — E669 Obesity, unspecified: Secondary | ICD-10-CM | POA: Diagnosis not present

## 2024-05-03 DIAGNOSIS — R748 Abnormal levels of other serum enzymes: Secondary | ICD-10-CM

## 2024-05-03 DIAGNOSIS — F17200 Nicotine dependence, unspecified, uncomplicated: Secondary | ICD-10-CM | POA: Insufficient documentation

## 2024-05-03 NOTE — Progress Notes (Signed)
 Established patient visit  Patient: Shane Hayes   DOB: 1971/05/23   53 y.o. Male  MRN: [id number] Visit Date: 05/03/2024  Today's healthcare provider: Blane Bunting, PA-C   Chief Complaint  Patient presents with   Follow-up    3 month f/u  no other concens   Subjective     HPI     Follow-up    Additional comments: 3 month f/u  no other concens      Last edited by Estill Hemming, CMA on 05/03/2024  9:10 AM.       Discussed the use of AI scribe software for clinical note transcription with the patient, who gave verbal consent to proceed.  History of Present Illness Shane Hayes is a 53 year old male who presents for a follow-up visit regarding his chronic conditions.  He is taking Xyzal 50 mg and 12.5 mg for blood pressure management and is actively reducing salt intake. His weight is stable at 240-242 pounds, and he aims to improve his exercise routine. He is reducing smoking, currently at two to four cigarettes daily.  He has no symptoms of depression or anxiety and maintains a positive outlook, enjoying family time. He experiences no double or blurry vision but dislikes wearing glasses.  He reports no chest pain, dyspnea, palpitations, bowel movement issues, leg swelling, or changes in urine or stool color. There is no nausea, vomiting, diarrhea, constipation, or jaundice.  His last lipid panel in March showed an LDL of 69. Liver enzymes were slightly elevated in January without related symptoms.       05/03/2024    9:19 AM 02/02/2024   10:40 AM 01/05/2024    9:15 AM  Depression screen PHQ 2/9  Decreased Interest 0 0 0  Down, Depressed, Hopeless 0 0 0  PHQ - 2 Score 0 0 0  Altered sleeping 0 0 0  Tired, decreased energy 0 0 0  Change in appetite 0 0 0  Feeling bad or failure about yourself  0 0 0  Trouble concentrating 0 0 0  Moving slowly or fidgety/restless 0 0 0  Suicidal thoughts 0 0 0  PHQ-9 Score 0 0 0  Difficult doing work/chores Not difficult at  all Not difficult at all       05/03/2024    9:19 AM 02/02/2024   10:40 AM 08/27/2023    1:07 PM  GAD 7 : Generalized Anxiety Score  Nervous, Anxious, on Edge 0 0 0  Control/stop worrying 0 0 0  Worry too much - different things 0 0 0  Trouble relaxing 0 0 0  Restless 0 0 0  Easily annoyed or irritable 0  0  Afraid - awful might happen 0 0 0  Total GAD 7 Score 0  0  Anxiety Difficulty Not difficult at all  Not difficult at all    Medications: Outpatient Medications Prior to Visit  Medication Sig   fluticasone  (FLONASE ) 50 MCG/ACT nasal spray Place 2 sprays into both nostrils daily.   losartan -hydrochlorothiazide  (HYZAAR) 50-12.5 MG tablet Take 1 tablet by mouth daily.   Multiple Vitamin (MULTIVITAMIN) tablet Take 1 tablet by mouth daily.   rosuvastatin  (CRESTOR ) 5 MG tablet Take 1 tablet (5 mg total) by mouth daily.   No facility-administered medications prior to visit.    Review of Systems All negative Except see HPI       Objective    BP 115/80 (BP Location: Right Arm, Patient Position: Sitting, Cuff Size: Normal)  Pulse 74   Resp 16   Ht 5\' 6"  (1.676 m)   Wt 242 lb (109.8 kg)   SpO2 100%   BMI 39.06 kg/m     Physical Exam Vitals reviewed.  Constitutional:      General: He is not in acute distress.    Appearance: Normal appearance. He is not diaphoretic.  HENT:     Head: Normocephalic and atraumatic.  Eyes:     General: No scleral icterus.    Conjunctiva/sclera: Conjunctivae normal.  Cardiovascular:     Rate and Rhythm: Normal rate and regular rhythm.     Pulses: Normal pulses.     Heart sounds: Normal heart sounds. No murmur heard. Pulmonary:     Effort: Pulmonary effort is normal. No respiratory distress.     Breath sounds: Normal breath sounds. No wheezing or rhonchi.  Musculoskeletal:     Cervical back: Neck supple.     Right lower leg: No edema.     Left lower leg: No edema.  Lymphadenopathy:     Cervical: No cervical adenopathy.  Skin:     General: Skin is warm and dry.     Findings: No rash.  Neurological:     Mental Status: He is alert and oriented to person, place, and time. Mental status is at baseline.  Psychiatric:        Mood and Affect: Mood normal.        Behavior: Behavior normal.      No results found for any visits on 05/03/24.      Assessment and Plan Assessment & Plan Hypertension Hypertension well-controlled with Xyzal and lifestyle modifications. Blood pressure stable. - Continue Xyzal 50/12.5 mg. - Encourage reduced salt intake. - Advise 150-300 minutes of physical activity weekly.  Hyperlipidemia Hyperlipidemia well-managed with LDL at 69 mg/dL. - Continue current management. - Encourage dietary modifications to maintain lipid levels.  Elevated Liver Enzymes Slightly elevated liver enzymes in January. No symptoms present. Deferred repeat tests until next visit unless symptoms develop. - Repeat liver function tests at the next visit.  Smoking Cessation Attempting to reduce smoking, currently 2-4 cigarettes per day. - Continue to support smoking cessation efforts. - Provide resources and support for smoking cessation as needed.  General Health Maintenance Advised on carbohydrate intake monitoring and weight management for blood pressure, cholesterol, and blood sugar control. - Encourage monitoring of carbohydrate intake using resources like the American Diabetic Association website. - Consider referral to a nutritionist if dietary changes are desired.  Obesity (BMI 30-39.9) Chronic and stable, assoc with htn, hlp Weight loss of 5% of pt's current weight via healthy diet and daily exercise encouraged. Will follow-up  Elevated liver enzymes Chronic Will recheck, asymptomatic for now  Labs at the follow-up and tdap  No orders of the defined types were placed in this encounter.   No follow-ups on file.   The patient was advised to call back or seek an in-person evaluation if the  symptoms worsen or if the condition fails to improve as anticipated.  I discussed the assessment and treatment plan with the patient. The patient was provided an opportunity to ask questions and all were answered. The patient agreed with the plan and demonstrated an understanding of the instructions.  I, Lauryl Seyer, PA-C have reviewed all documentation for this visit. The documentation on 05/03/2024  for the exam, diagnosis, procedures, and orders are all accurate and complete.  Blane Bunting, [facility], MMS [facility] [phone] (phone) [phone] (fax)  [facility]

## 2024-08-02 ENCOUNTER — Ambulatory Visit: Admitting: Physician Assistant

## 2024-08-02 ENCOUNTER — Encounter: Payer: Self-pay | Admitting: Physician Assistant

## 2024-08-02 VITALS — BP 132/95 | HR 80 | Resp 16 | Ht 66.0 in | Wt 233.0 lb

## 2024-08-02 DIAGNOSIS — I10 Essential (primary) hypertension: Secondary | ICD-10-CM | POA: Diagnosis not present

## 2024-08-02 DIAGNOSIS — F17200 Nicotine dependence, unspecified, uncomplicated: Secondary | ICD-10-CM

## 2024-08-02 DIAGNOSIS — E669 Obesity, unspecified: Secondary | ICD-10-CM

## 2024-08-02 DIAGNOSIS — Z23 Encounter for immunization: Secondary | ICD-10-CM

## 2024-08-02 DIAGNOSIS — E7849 Other hyperlipidemia: Secondary | ICD-10-CM | POA: Diagnosis not present

## 2024-08-02 DIAGNOSIS — R748 Abnormal levels of other serum enzymes: Secondary | ICD-10-CM

## 2024-08-02 NOTE — Progress Notes (Signed)
 " Established patient visit  Patient: Shane Hayes   DOB: Mar 11, 1971   53 y.o. Male  MRN: [id number] Visit Date: 08/02/2024  Today's healthcare provider: Jolynn Spencer, PA-C   Chief Complaint  Patient presents with   Medical Management of Chronic Issues    Decline Shingles, Prevnar, Flu vaccines Has not been taking cholesterol med.   Hypertension   Subjective      Discussed the use of AI scribe software for clinical note transcription with the patient, who gave verbal consent to proceed.  History of Present Illness Shane Hayes is a 53 year old male with hypertension who presents for blood pressure management and lifestyle modification.  He experiences fluctuating blood pressure and has not been taking Xsar 50/12.5 mg consistently. He has not taken any medication today. He is actively working on lifestyle changes, including reducing smoking, losing weight, and improving his diet by increasing water intake, reducing soda, and incorporating more fruits and vegetables. He has lost a couple of pounds and aims to reach 220 pounds. He is reducing sugar intake despite a preference for sweets. Physical activity is occasional, with some push-ups, but he acknowledges the need to increase exercise.       05/03/2024    9:19 AM 02/02/2024   10:40 AM 01/05/2024    9:15 AM  Depression screen PHQ 2/9  Decreased Interest 0 0 0  Down, Depressed, Hopeless 0 0 0  PHQ - 2 Score 0 0 0  Altered sleeping 0 0 0  Tired, decreased energy 0 0 0  Change in appetite 0 0 0  Feeling bad or failure about yourself  0 0 0  Trouble concentrating 0 0 0  Moving slowly or fidgety/restless 0 0 0  Suicidal thoughts 0 0 0  PHQ-9 Score 0 0 0  Difficult doing work/chores Not difficult at all Not difficult at all       05/03/2024    9:19 AM 02/02/2024   10:40 AM 08/27/2023    1:07 PM  GAD 7 : Generalized Anxiety Score  Nervous, Anxious, on Edge 0 0 0  Control/stop worrying 0 0 0  Worry too much - different  things 0 0 0  Trouble relaxing 0 0 0  Restless 0 0 0  Easily annoyed or irritable 0  0  Afraid - awful might happen 0 0 0  Total GAD 7 Score 0  0  Anxiety Difficulty Not difficult at all  Not difficult at all    Medications: Outpatient Medications Prior to Visit  Medication Sig   losartan -hydrochlorothiazide  (HYZAAR) 50-12.5 MG tablet Take 1 tablet by mouth daily.   fluticasone  (FLONASE ) 50 MCG/ACT nasal spray Place 2 sprays into both nostrils daily. (Patient not taking: Reported on 08/02/2024)   Multiple Vitamin (MULTIVITAMIN) tablet Take 1 tablet by mouth daily. (Patient not taking: Reported on 08/02/2024)   rosuvastatin  (CRESTOR ) 5 MG tablet Take 1 tablet (5 mg total) by mouth daily. (Patient not taking: Reported on 08/02/2024)   No facility-administered medications prior to visit.    Review of Systems  All other systems reviewed and are negative.  All negative Except see HPI       Objective    BP (!) 132/95   Pulse 80   Resp 16   Ht 5' 6 (1.676 m)   Wt 233 lb (105.7 kg)   SpO2 98%   BMI 37.61 kg/m     Physical Exam Vitals reviewed.  Constitutional:      General: He  is not in acute distress.    Appearance: Normal appearance. He is obese. He is not diaphoretic.  HENT:     Head: Normocephalic and atraumatic.  Eyes:     General: No scleral icterus.    Conjunctiva/sclera: Conjunctivae normal.  Cardiovascular:     Rate and Rhythm: Normal rate and regular rhythm.     Pulses: Normal pulses.     Heart sounds: Normal heart sounds. No murmur heard. Pulmonary:     Effort: Pulmonary effort is normal. No respiratory distress.     Breath sounds: Normal breath sounds. No wheezing or rhonchi.  Musculoskeletal:     Cervical back: Neck supple.     Right lower leg: No edema.     Left lower leg: No edema.  Lymphadenopathy:     Cervical: No cervical adenopathy.  Skin:    General: Skin is warm and dry.     Findings: No rash.  Neurological:     Mental Status: He is alert  and oriented to person, place, and time. Mental status is at baseline.  Psychiatric:        Mood and Affect: Mood normal.        Behavior: Behavior normal.      No results found for any visits on 08/02/24.      Assessment & Plan Essential hypertension Chronic and unstable Blood pressure poorly controlled due to non-adherence. - Encouraged adherence to Hyzaar 50/12.5. - Discussed importance of medication adherence. Continue low salt diet and regular exercise Will follow-up  Hyperlipidemia Chronic and stable Non-adherence to lipid-lowering medication. - Encouraged adherence to prescribed medication. Rosuvastatin  5mg  Continue low cholesterol diet and regular exercise Will follow-up  Obesity Chronic  and improving Attempting lifestyle modifications with some weight loss. - Encouraged dietary improvements and increased physical activity. - Set goal of losing one pound per week, aiming for eight-pound loss over two months. Lab work was reviewed from 12/08/23 Will follow-up  Tobacco use disorder/Nicotine dependence Attempting to reduce smoking frequency. Will revisit  Elevated liver enzymes - Plan to recheck liver enzymes in two months. Will follow-up  General Health Maintenance Considering vaccinations with concerns about side effects. - Plan to administer shingles vaccine in two months. - Discussed hepatitis B and pneumonia vaccines.  Follow-Up Prefers follow-up in two months for progress monitoring. - Scheduled follow-up in two months. - Plan to perform blood work at next visit.  Immunization due  - Tdap vaccine greater than or equal to 7yo IM  No orders of the defined types were placed in this encounter.   No follow-ups on file.   The patient was advised to call back or seek an in-person evaluation if the symptoms worsen or if the condition fails to improve as anticipated.  I discussed the assessment and treatment plan with the patient. The patient was  provided an opportunity to ask questions and all were answered. The patient agreed with the plan and demonstrated an understanding of the instructions.  I, Ketzia Guzek, PA-C have reviewed all documentation for this visit. The documentation on 08/02/2024  for the exam, diagnosis, procedures, and orders are all accurate and complete.  Jolynn Spencer, [facility], MMS [facility] [phone] (phone) [phone] (fax)  [facility] "

## 2024-08-03 DIAGNOSIS — R748 Abnormal levels of other serum enzymes: Secondary | ICD-10-CM | POA: Insufficient documentation

## 2024-10-03 NOTE — Progress Notes (Deleted)
 Established patient visit  Patient: Shane Hayes   DOB: 31-May-1971   53 y.o. Male  MRN: [id number] Visit Date: 10/04/2024  Today's healthcare provider: Jolynn Spencer, PA-C   No chief complaint on file.  Subjective       Discussed the use of AI scribe software for clinical note transcription with the patient, who gave verbal consent to proceed.  History of Present Illness        05/03/2024    9:19 AM 02/02/2024   10:40 AM 01/05/2024    9:15 AM  Depression screen PHQ 2/9  Decreased Interest 0 0 0  Down, Depressed, Hopeless 0 0 0  PHQ - 2 Score 0 0 0  Altered sleeping 0 0 0  Tired, decreased energy 0 0 0  Change in appetite 0 0 0  Feeling bad or failure about yourself  0 0 0  Trouble concentrating 0 0 0  Moving slowly or fidgety/restless 0 0 0  Suicidal thoughts 0 0 0  PHQ-9 Score 0  0  0   Difficult doing work/chores Not difficult at all Not difficult at all      Data saved with a previous flowsheet row definition      05/03/2024    9:19 AM 02/02/2024   10:40 AM 08/27/2023    1:07 PM  GAD 7 : Generalized Anxiety Score  Nervous, Anxious, on Edge 0 0 0  Control/stop worrying 0 0 0  Worry too much - different things 0 0 0  Trouble relaxing 0 0 0  Restless 0 0 0  Easily annoyed or irritable 0  0  Afraid - awful might happen 0 0 0  Total GAD 7 Score 0  0  Anxiety Difficulty Not difficult at all  Not difficult at all    Medications: Outpatient Medications Prior to Visit  Medication Sig  . fluticasone  (FLONASE ) 50 MCG/ACT nasal spray Place 2 sprays into both nostrils daily. (Patient not taking: Reported on 08/02/2024)  . losartan -hydrochlorothiazide  (HYZAAR) 50-12.5 MG tablet Take 1 tablet by mouth daily.  . Multiple Vitamin (MULTIVITAMIN) tablet Take 1 tablet by mouth daily. (Patient not taking: Reported on 08/02/2024)  . rosuvastatin  (CRESTOR ) 5 MG tablet Take 1 tablet (5 mg total) by mouth daily. (Patient not taking: Reported on 08/02/2024)   No  facility-administered medications prior to visit.    Review of Systems  All other systems reviewed and are negative.  All negative Except see HPI   {Insert previous labs (optional):23779} {See past labs  Heme  Chem  Endocrine  Serology  Results Review (optional):1}   Objective    There were no vitals taken for this visit. {Insert last BP/Wt (optional):23777}{See vitals history (optional):1}   Physical Exam Vitals reviewed.  Constitutional:      General: He is not in acute distress.    Appearance: Normal appearance. He is not diaphoretic.  HENT:     Head: Normocephalic and atraumatic.  Eyes:     General: No scleral icterus.    Conjunctiva/sclera: Conjunctivae normal.  Cardiovascular:     Rate and Rhythm: Normal rate and regular rhythm.     Pulses: Normal pulses.     Heart sounds: Normal heart sounds. No murmur heard. Pulmonary:     Effort: Pulmonary effort is normal. No respiratory distress.     Breath sounds: Normal breath sounds. No wheezing or rhonchi.  Musculoskeletal:     Cervical back: Neck supple.     Right lower leg: No edema.  Left lower leg: No edema.  Lymphadenopathy:     Cervical: No cervical adenopathy.  Skin:    General: Skin is warm and dry.     Findings: No rash.  Neurological:     Mental Status: He is alert and oriented to person, place, and time. Mental status is at baseline.  Psychiatric:        Mood and Affect: Mood normal.        Behavior: Behavior normal.     No results found for any visits on 10/04/24.      Assessment and Plan Assessment & Plan     No orders of the defined types were placed in this encounter.   No follow-ups on file.   The patient was advised to call back or seek an in-person evaluation if the symptoms worsen or if the condition fails to improve as anticipated.  I discussed the assessment and treatment plan with the patient. The patient was provided an opportunity to ask questions and all were answered.  The patient agreed with the plan and demonstrated an understanding of the instructions.  I, Maciah Schweigert, PA-C have reviewed all documentation for this visit. The documentation on 10/04/2024  for the exam, diagnosis, procedures, and orders are all accurate and complete.  Jolynn Spencer, [facility], MMS [facility] [phone] (phone) [phone] (fax)  [facility]

## 2024-10-04 ENCOUNTER — Ambulatory Visit: Admitting: Physician Assistant

## 2024-10-04 DIAGNOSIS — R748 Abnormal levels of other serum enzymes: Secondary | ICD-10-CM

## 2024-10-04 DIAGNOSIS — I10 Essential (primary) hypertension: Secondary | ICD-10-CM

## 2024-10-04 DIAGNOSIS — F17200 Nicotine dependence, unspecified, uncomplicated: Secondary | ICD-10-CM

## 2024-10-04 DIAGNOSIS — E669 Obesity, unspecified: Secondary | ICD-10-CM

## 2024-10-04 DIAGNOSIS — E7849 Other hyperlipidemia: Secondary | ICD-10-CM
